# Patient Record
Sex: Female | Born: 1950 | Race: White | Hispanic: No | Smoking: Current every day smoker
Health system: Southern US, Community
[De-identification: ages and names within clinical notes are randomized; demographics above are authoritative.]

## PROBLEM LIST (undated history)

## (undated) DIAGNOSIS — M1990 Unspecified osteoarthritis, unspecified site: Secondary | ICD-10-CM

## (undated) DIAGNOSIS — F419 Anxiety disorder, unspecified: Secondary | ICD-10-CM

## (undated) DIAGNOSIS — M5136 Other intervertebral disc degeneration, lumbar region: Secondary | ICD-10-CM

## (undated) DIAGNOSIS — F329 Major depressive disorder, single episode, unspecified: Secondary | ICD-10-CM

## (undated) DIAGNOSIS — M8580 Other specified disorders of bone density and structure, unspecified site: Secondary | ICD-10-CM

## (undated) DIAGNOSIS — K2270 Barrett's esophagus without dysplasia: Secondary | ICD-10-CM

## (undated) DIAGNOSIS — Z972 Presence of dental prosthetic device (complete) (partial): Secondary | ICD-10-CM

## (undated) DIAGNOSIS — M5030 Other cervical disc degeneration, unspecified cervical region: Secondary | ICD-10-CM

## (undated) DIAGNOSIS — J45909 Unspecified asthma, uncomplicated: Secondary | ICD-10-CM

## (undated) DIAGNOSIS — M5126 Other intervertebral disc displacement, lumbar region: Secondary | ICD-10-CM

## (undated) DIAGNOSIS — K219 Gastro-esophageal reflux disease without esophagitis: Secondary | ICD-10-CM

## (undated) DIAGNOSIS — F32A Depression, unspecified: Secondary | ICD-10-CM

## (undated) DIAGNOSIS — M51369 Other intervertebral disc degeneration, lumbar region without mention of lumbar back pain or lower extremity pain: Secondary | ICD-10-CM

## (undated) DIAGNOSIS — E785 Hyperlipidemia, unspecified: Secondary | ICD-10-CM

## (undated) DIAGNOSIS — G4730 Sleep apnea, unspecified: Secondary | ICD-10-CM

## (undated) DIAGNOSIS — M5430 Sciatica, unspecified side: Secondary | ICD-10-CM

## (undated) DIAGNOSIS — J449 Chronic obstructive pulmonary disease, unspecified: Secondary | ICD-10-CM

## (undated) DIAGNOSIS — K759 Inflammatory liver disease, unspecified: Secondary | ICD-10-CM

## (undated) DIAGNOSIS — G8929 Other chronic pain: Secondary | ICD-10-CM

## (undated) DIAGNOSIS — K449 Diaphragmatic hernia without obstruction or gangrene: Secondary | ICD-10-CM

## (undated) HISTORY — PX: HAMMER TOE SURGERY: SHX385

## (undated) HISTORY — PX: BREAST SURGERY: SHX581

## (undated) HISTORY — PX: ABDOMINAL HYSTERECTOMY: SHX81

## (undated) HISTORY — PX: ESOPHAGEAL DILATION: SHX303

## (undated) HISTORY — PX: EYE SURGERY: SHX253

---

## 1999-08-17 HISTORY — PX: AUGMENTATION MAMMAPLASTY: SUR837

## 2013-06-05 ENCOUNTER — Emergency Department: Payer: Self-pay | Admitting: Emergency Medicine

## 2013-09-18 ENCOUNTER — Emergency Department: Payer: Self-pay | Admitting: Emergency Medicine

## 2013-12-11 LAB — HM MAMMOGRAPHY

## 2014-01-09 DIAGNOSIS — R1110 Vomiting, unspecified: Secondary | ICD-10-CM | POA: Insufficient documentation

## 2014-01-09 LAB — HM COLONOSCOPY
HM COLON: NORMAL
HM Colonoscopy: NORMAL

## 2014-01-10 DIAGNOSIS — E876 Hypokalemia: Secondary | ICD-10-CM | POA: Insufficient documentation

## 2014-07-23 DIAGNOSIS — Z79891 Long term (current) use of opiate analgesic: Secondary | ICD-10-CM | POA: Diagnosis not present

## 2014-07-23 DIAGNOSIS — M4726 Other spondylosis with radiculopathy, lumbar region: Secondary | ICD-10-CM | POA: Diagnosis not present

## 2014-07-23 DIAGNOSIS — M4722 Other spondylosis with radiculopathy, cervical region: Secondary | ICD-10-CM | POA: Diagnosis not present

## 2014-07-23 DIAGNOSIS — G8929 Other chronic pain: Secondary | ICD-10-CM | POA: Diagnosis not present

## 2014-09-04 ENCOUNTER — Emergency Department: Payer: Self-pay | Admitting: Emergency Medicine

## 2014-09-04 DIAGNOSIS — G8929 Other chronic pain: Secondary | ICD-10-CM | POA: Diagnosis not present

## 2014-09-04 DIAGNOSIS — Z880 Allergy status to penicillin: Secondary | ICD-10-CM | POA: Diagnosis not present

## 2014-09-04 DIAGNOSIS — M542 Cervicalgia: Secondary | ICD-10-CM | POA: Diagnosis not present

## 2014-09-04 DIAGNOSIS — M549 Dorsalgia, unspecified: Secondary | ICD-10-CM | POA: Diagnosis not present

## 2014-09-04 DIAGNOSIS — F419 Anxiety disorder, unspecified: Secondary | ICD-10-CM | POA: Diagnosis not present

## 2014-09-26 DIAGNOSIS — J449 Chronic obstructive pulmonary disease, unspecified: Secondary | ICD-10-CM | POA: Diagnosis not present

## 2014-09-26 DIAGNOSIS — F17211 Nicotine dependence, cigarettes, in remission: Secondary | ICD-10-CM | POA: Diagnosis not present

## 2014-09-26 DIAGNOSIS — R0602 Shortness of breath: Secondary | ICD-10-CM | POA: Diagnosis not present

## 2014-09-26 DIAGNOSIS — G479 Sleep disorder, unspecified: Secondary | ICD-10-CM | POA: Diagnosis not present

## 2014-10-21 DIAGNOSIS — Z79891 Long term (current) use of opiate analgesic: Secondary | ICD-10-CM | POA: Diagnosis not present

## 2014-10-21 DIAGNOSIS — M4722 Other spondylosis with radiculopathy, cervical region: Secondary | ICD-10-CM | POA: Diagnosis not present

## 2014-10-21 DIAGNOSIS — M4726 Other spondylosis with radiculopathy, lumbar region: Secondary | ICD-10-CM | POA: Diagnosis not present

## 2014-10-21 DIAGNOSIS — G8929 Other chronic pain: Secondary | ICD-10-CM | POA: Diagnosis not present

## 2014-11-05 DIAGNOSIS — H16223 Keratoconjunctivitis sicca, not specified as Sjogren's, bilateral: Secondary | ICD-10-CM | POA: Diagnosis not present

## 2014-11-13 DIAGNOSIS — H16223 Keratoconjunctivitis sicca, not specified as Sjogren's, bilateral: Secondary | ICD-10-CM | POA: Diagnosis not present

## 2014-12-17 DIAGNOSIS — J449 Chronic obstructive pulmonary disease, unspecified: Secondary | ICD-10-CM | POA: Diagnosis not present

## 2014-12-17 DIAGNOSIS — F17209 Nicotine dependence, unspecified, with unspecified nicotine-induced disorders: Secondary | ICD-10-CM | POA: Insufficient documentation

## 2014-12-17 DIAGNOSIS — G479 Sleep disorder, unspecified: Secondary | ICD-10-CM | POA: Insufficient documentation

## 2014-12-17 DIAGNOSIS — F17210 Nicotine dependence, cigarettes, uncomplicated: Secondary | ICD-10-CM | POA: Insufficient documentation

## 2014-12-17 DIAGNOSIS — J441 Chronic obstructive pulmonary disease with (acute) exacerbation: Secondary | ICD-10-CM | POA: Diagnosis not present

## 2014-12-17 DIAGNOSIS — Z720 Tobacco use: Secondary | ICD-10-CM | POA: Diagnosis not present

## 2014-12-17 DIAGNOSIS — J310 Chronic rhinitis: Secondary | ICD-10-CM | POA: Diagnosis not present

## 2014-12-18 DIAGNOSIS — H16223 Keratoconjunctivitis sicca, not specified as Sjogren's, bilateral: Secondary | ICD-10-CM | POA: Diagnosis not present

## 2014-12-23 DIAGNOSIS — J208 Acute bronchitis due to other specified organisms: Secondary | ICD-10-CM | POA: Diagnosis not present

## 2014-12-23 DIAGNOSIS — J069 Acute upper respiratory infection, unspecified: Secondary | ICD-10-CM | POA: Diagnosis not present

## 2014-12-23 DIAGNOSIS — B9689 Other specified bacterial agents as the cause of diseases classified elsewhere: Secondary | ICD-10-CM | POA: Diagnosis not present

## 2015-01-01 DIAGNOSIS — H16223 Keratoconjunctivitis sicca, not specified as Sjogren's, bilateral: Secondary | ICD-10-CM | POA: Diagnosis not present

## 2015-01-14 DIAGNOSIS — F17210 Nicotine dependence, cigarettes, uncomplicated: Secondary | ICD-10-CM | POA: Diagnosis not present

## 2015-01-14 DIAGNOSIS — J209 Acute bronchitis, unspecified: Secondary | ICD-10-CM | POA: Diagnosis not present

## 2015-01-14 DIAGNOSIS — J310 Chronic rhinitis: Secondary | ICD-10-CM | POA: Diagnosis not present

## 2015-01-14 DIAGNOSIS — J449 Chronic obstructive pulmonary disease, unspecified: Secondary | ICD-10-CM | POA: Diagnosis not present

## 2015-01-14 DIAGNOSIS — R05 Cough: Secondary | ICD-10-CM | POA: Diagnosis not present

## 2015-01-17 DIAGNOSIS — G8929 Other chronic pain: Secondary | ICD-10-CM | POA: Diagnosis not present

## 2015-01-17 DIAGNOSIS — M791 Myalgia: Secondary | ICD-10-CM | POA: Diagnosis not present

## 2015-01-17 DIAGNOSIS — Z79891 Long term (current) use of opiate analgesic: Secondary | ICD-10-CM | POA: Diagnosis not present

## 2015-01-17 DIAGNOSIS — M4726 Other spondylosis with radiculopathy, lumbar region: Secondary | ICD-10-CM | POA: Diagnosis not present

## 2015-02-06 ENCOUNTER — Encounter: Payer: Self-pay | Admitting: *Deleted

## 2015-02-06 ENCOUNTER — Emergency Department
Admission: EM | Admit: 2015-02-06 | Discharge: 2015-02-06 | Disposition: A | Discharge status: Home / Self Care | Payer: Medicare Other | Attending: Emergency Medicine | Admitting: Emergency Medicine

## 2015-02-06 DIAGNOSIS — Y998 Other external cause status: Secondary | ICD-10-CM | POA: Insufficient documentation

## 2015-02-06 DIAGNOSIS — Z79899 Other long term (current) drug therapy: Secondary | ICD-10-CM | POA: Diagnosis not present

## 2015-02-06 DIAGNOSIS — S239XXA Sprain of unspecified parts of thorax, initial encounter: Secondary | ICD-10-CM

## 2015-02-06 DIAGNOSIS — Y9389 Activity, other specified: Secondary | ICD-10-CM | POA: Insufficient documentation

## 2015-02-06 DIAGNOSIS — S233XXA Sprain of ligaments of thoracic spine, initial encounter: Secondary | ICD-10-CM | POA: Diagnosis not present

## 2015-02-06 DIAGNOSIS — S29012A Strain of muscle and tendon of back wall of thorax, initial encounter: Secondary | ICD-10-CM | POA: Insufficient documentation

## 2015-02-06 DIAGNOSIS — S299XXA Unspecified injury of thorax, initial encounter: Secondary | ICD-10-CM | POA: Diagnosis present

## 2015-02-06 DIAGNOSIS — Z720 Tobacco use: Secondary | ICD-10-CM | POA: Diagnosis not present

## 2015-02-06 DIAGNOSIS — Z880 Allergy status to penicillin: Secondary | ICD-10-CM | POA: Diagnosis not present

## 2015-02-06 DIAGNOSIS — Y9289 Other specified places as the place of occurrence of the external cause: Secondary | ICD-10-CM | POA: Insufficient documentation

## 2015-02-06 DIAGNOSIS — X58XXXA Exposure to other specified factors, initial encounter: Secondary | ICD-10-CM | POA: Insufficient documentation

## 2015-02-06 HISTORY — DX: Other chronic pain: G89.29

## 2015-02-06 MED ORDER — KETOROLAC TROMETHAMINE 60 MG/2ML IM SOLN
INTRAMUSCULAR | Status: AC
  Filled 2015-02-06: qty 2

## 2015-02-06 MED ORDER — ORPHENADRINE CITRATE ER 100 MG PO TB12: 100.0000 mg | ORAL_TABLET | Freq: Two times a day (BID) | ORAL | Status: DC | PRN

## 2015-02-06 MED ORDER — KETOROLAC TROMETHAMINE 60 MG/2ML IM SOLN
60.0000 mg | Freq: Once | INTRAMUSCULAR | Status: AC
  Administered 2015-02-06: 60 mg via INTRAMUSCULAR

## 2015-02-06 MED ORDER — KETOROLAC TROMETHAMINE 10 MG PO TABS: 10.0000 mg | ORAL_TABLET | Freq: Three times a day (TID) | ORAL | Status: DC

## 2015-02-06 NOTE — Discharge Instructions (Signed)
Back Pain, Adult Back pain is very common. The pain often gets better over time. The cause of back pain is usually not dangerous. Most people can learn to manage their back pain on their own.  HOME CARE   Stay active. Start with short walks on flat ground if you can. Try to walk farther each day.  Do not sit, drive, or stand in one place for more than 30 minutes. Do not stay in bed.  Do not avoid exercise or work. Activity can help your back heal faster.  Be careful when you bend or lift an object. Bend at your knees, keep the object close to you, and do not twist.  Sleep on a firm mattress. Lie on your side, and bend your knees. If you lie on your back, put a pillow under your knees.  Only take medicines as told by your doctor.  Put ice on the injured area.  Put ice in a plastic bag.  Place a towel between your skin and the bag.  Leave the ice on for 15-20 minutes, 03-04 times a day for the first 2 to 3 days. After that, you can switch between ice and heat packs.  Ask your doctor about back exercises or massage.  Avoid feeling anxious or stressed. Find good ways to deal with stress, such as exercise. GET HELP RIGHT AWAY IF:   Your pain does not go away with rest or medicine.  Your pain does not go away in 1 week.  You have new problems.  You do not feel well.  The pain spreads into your legs.  You cannot control when you poop (bowel movement) or pee (urinate).  Your arms or legs feel weak or lose feeling (numbness).  You feel sick to your stomach (nauseous) or throw up (vomit).  You have belly (abdominal) pain.  You feel like you may pass out (faint). MAKE SURE YOU:   Understand these instructions.  Will watch your condition.  Will get help right away if you are not doing well or get worse. Document Released: 01/19/2008 Document Revised: 10/25/2011 Document Reviewed: 12/04/2013 [facility] Patient Information 2015 [place], [place]. This information is not intended  to replace advice given to you by your health care provider. Make sure you discuss any questions you have with your health care provider.  Thoracic Strain Thoracic strain is an injury to the muscles of the upper back. A mild strain may take only 1 week to heal. Torn muscles or tendons may take 6 weeks to 2 months to heal. HOME CARE  Put ice on the injured area.  Put ice in a plastic bag.  Place a towel between your skin and the bag.  Leave the ice on for 15-20 minutes, 03-04 times a day, for the first 2 days.  Only take medicine as told by your doctor.  Go to physical therapy and perform exercises as told by your doctor.  Use wraps and back braces as told by your doctor.  Warm up before being active. GET HELP RIGHT AWAY IF:   There is more bruising, puffiness (swelling), or pain.  Medicine does not help the pain.  You have trouble breathing, chest pain, or a fever.  Your problems seem to be getting worse, not better. MAKE SURE YOU:   Understand these instructions.  Will watch your condition.  Will get help right away if you are not doing well or get worse. Document Released: 01/19/2008 Document Revised: 10/25/2011 Document Reviewed: 09/21/2010 [employer] Patient Information  2015 [employer]. This information is not intended to replace advice given to you by your health care provider. Make sure you discuss any questions you have with your health care provider.   Take the prescription meds as directed.  Continue your home meds. Follow-up with your provider or Dr. Marry Guan if needed.

## 2015-02-06 NOTE — ED Provider Notes (Signed)
[facility] Emergency Department Provider Note ____________________________________________  Time seen: 1725  I have reviewed the triage vital signs and the nursing notes.  HISTORY  Chief Complaint  Neck Pain and Shoulder Pain  HPI Kim Farmer is a 64 y.o. female, right hand dominant, who reports to the ED for evaluation and management of her acute onset left upper back pain. She describes pain after waking this morning to the right scapular region with some referral into the right side of the neck. She was able to work today, but she kept her arm tucked to her side throughout the day. She is not requesting narcotic pain medicine, noting she is a patient of a pain clinic at the [facility]. She has recently relocated to the area, and has not established care with a primary provider at this time. She denies injury, tripped, slipped, or fall prior to onset of her symptoms. 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  Past Medical History  Diagnosis Date  . Chronic pain    There are no active problems to display for this patient.  History reviewed. No pertinent past surgical history.  Current Outpatient Rx  Name  Route  Sig  Dispense  Refill  . ALPRAZolam (XANAX) 1 MG tablet   Oral   Take 1 mg by mouth 3 (three) times daily.         Marland Kitchen oxyCODONE (ROXICODONE) 15 MG immediate release tablet   Oral   Take 15 mg by mouth every 8 (eight) hours as needed for pain.         Marland Kitchen ketorolac (TORADOL) 10 MG tablet   Oral   Take 1 tablet (10 mg total) by mouth every 8 (eight) hours.   15 tablet   0   . orphenadrine (NORFLEX) 100 MG tablet   Oral   Take 1 tablet (100 mg total) by mouth 2 (two) times daily as needed for muscle spasms.   20 tablet   0    Allergies Amoxicillin; Penicillins; and Sulfa antibiotics  History reviewed. No pertinent  family history.  Social History History  Substance Use Topics  . Smoking status: Current Every Day Smoker  . Smokeless tobacco: Not on file  . Alcohol Use: Not on file   Review of Systems  Constitutional: Negative for fever. Eyes: Negative for visual changes. ENT: Negative for sore throat. Cardiovascular: Negative for chest pain. Respiratory: Negative for shortness of breath. Gastrointestinal: Negative for abdominal pain, vomiting and diarrhea. Genitourinary: Negative for dysuria. Musculoskeletal: Reports right scapular and upper back pain as above. Skin: Negative for rash. Neurological: Negative for headaches, focal weakness or numbness. ____________________________________________  PHYSICAL EXAM:  VITAL SIGNS: ED Triage Vitals  Enc Vitals Group     BP 02/06/15 1700 107/59 mmHg     Pulse --      Resp 02/06/15 1700 18     Temp 02/06/15 1700 98.6 F (37 C)     Temp Source 02/06/15 1700 Oral     SpO2 02/06/15 1700 96 %     Weight 02/06/15 1700 126 lb (57.153 kg)     Height 02/06/15 1700 5\' 3"  (1.6 m)     Head Cir --      Peak Flow --      Pain Score 02/06/15 1701 10     Pain Loc --      Pain Edu? --  Excl. in [place]? --    Constitutional: Alert and oriented. Well appearing and in no distress. Eyes: Conjunctivae are normal. PERRL. Normal extraocular movements. ENT   Head: Normocephalic and atraumatic.   Nose: No congestion/rhinnorhea.   Mouth/Throat: Mucous membranes are moist.   Neck: Supple. No thyromegaly. Hematological/Lymphatic/Immunilogical: No cervical lymphadenopathy. Cardiovascular: Normal rate, regular rhythm. Normal distal pulse. Respiratory: Normal respiratory effort. No wheezes/rales/rhonchi. Musculoskeletal: Nontender with normal range of motion in the neck. Full right shoulder ROM without catch, click, or lock. Normal rotator cuff testing. Normal grip strength. Neurologic:  Normal gait without ataxia. Normal speech and language. No gross  focal neurologic deficits are appreciated. Normal UE DTRs bilaterally.  Normal composite fists Skin:  Skin is warm, dry and intact. No rash noted. Psychiatric: Mood and affect are normal. Patient exhibits appropriate insight and judgment. ____________________________________________  PROCEDURES  Toradol 60 mg IM ____________________________________________  INITIAL IMPRESSION / ASSESSMENT AND PLAN / ED COURSE  Musculoskeletal pain in the right scapulothoracic region with palpable muscle tenderness and spasms. Suggest treatment with Toradol and Norflex.  Patient to follow-up with Dr. Marry Guan or pain management provider as needed.  ____________________________________________  FINAL CLINICAL IMPRESSION(S) / ED DIAGNOSES  Final diagnoses:  Thoracic back sprain, initial encounter     Melvenia Needles, PA-C 02/06/15 1858  Lavonia Drafts, MD 02/06/15 2148

## 2015-02-06 NOTE — ED Notes (Signed)
Pt alert and oriented X4, active, cooperative, pt in NAD. RR even and unlabored, color WNL.  Pt informed to return if any life threatening symptoms occur.   

## 2015-02-06 NOTE — ED Notes (Signed)
Pt arrives with complaints of right neck and shoulder pain, pt is on pain management doctor [facility], pt states she gets shots and Po pain management, pt tearful and anxious during assessment

## 2015-02-10 ENCOUNTER — Telehealth: Payer: Self-pay | Admitting: Emergency Medicine

## 2015-02-10 NOTE — ED Notes (Signed)
Pt is in lobby asking why we did not fax a work release to her employer.  i explained that we do not have contact with a patient's employer unless it is a Insurance underwriter comp case.  She says she had a sling on Friday and they sent her home.  Says she is not longer wearing sling and it is much better.  Gave her note to return to work.

## 2015-03-05 DIAGNOSIS — L239 Allergic contact dermatitis, unspecified cause: Secondary | ICD-10-CM | POA: Diagnosis not present

## 2015-03-13 DIAGNOSIS — D485 Neoplasm of uncertain behavior of skin: Secondary | ICD-10-CM | POA: Diagnosis not present

## 2015-03-13 DIAGNOSIS — D040 Carcinoma in situ of skin of lip: Secondary | ICD-10-CM | POA: Diagnosis not present

## 2015-03-13 DIAGNOSIS — B001 Herpesviral vesicular dermatitis: Secondary | ICD-10-CM | POA: Diagnosis not present

## 2015-03-13 DIAGNOSIS — L309 Dermatitis, unspecified: Secondary | ICD-10-CM | POA: Diagnosis not present

## 2015-03-27 DIAGNOSIS — C4402 Squamous cell carcinoma of skin of lip: Secondary | ICD-10-CM | POA: Diagnosis not present

## 2015-03-27 DIAGNOSIS — D492 Neoplasm of unspecified behavior of bone, soft tissue, and skin: Secondary | ICD-10-CM | POA: Diagnosis not present

## 2015-03-31 DIAGNOSIS — L820 Inflamed seborrheic keratosis: Secondary | ICD-10-CM | POA: Diagnosis not present

## 2015-04-11 DIAGNOSIS — M791 Myalgia: Secondary | ICD-10-CM | POA: Diagnosis not present

## 2015-04-11 DIAGNOSIS — Z79891 Long term (current) use of opiate analgesic: Secondary | ICD-10-CM | POA: Diagnosis not present

## 2015-04-11 DIAGNOSIS — G8929 Other chronic pain: Secondary | ICD-10-CM | POA: Diagnosis not present

## 2015-04-11 DIAGNOSIS — M4726 Other spondylosis with radiculopathy, lumbar region: Secondary | ICD-10-CM | POA: Diagnosis not present

## 2015-04-23 DIAGNOSIS — J441 Chronic obstructive pulmonary disease with (acute) exacerbation: Secondary | ICD-10-CM | POA: Diagnosis not present

## 2015-04-23 DIAGNOSIS — R0602 Shortness of breath: Secondary | ICD-10-CM | POA: Diagnosis not present

## 2015-04-23 DIAGNOSIS — R05 Cough: Secondary | ICD-10-CM | POA: Diagnosis not present

## 2015-04-23 DIAGNOSIS — R042 Hemoptysis: Secondary | ICD-10-CM | POA: Diagnosis not present

## 2015-04-30 DIAGNOSIS — C4402 Squamous cell carcinoma of skin of lip: Secondary | ICD-10-CM | POA: Diagnosis not present

## 2015-05-18 ENCOUNTER — Encounter: Payer: Self-pay | Admitting: Emergency Medicine

## 2015-05-18 ENCOUNTER — Emergency Department: Payer: Medicare Other

## 2015-05-18 ENCOUNTER — Emergency Department
Admission: EM | Admit: 2015-05-18 | Discharge: 2015-05-18 | Disposition: A | Discharge status: Home / Self Care | Payer: Medicare Other | Attending: Emergency Medicine | Admitting: Emergency Medicine

## 2015-05-18 DIAGNOSIS — K769 Liver disease, unspecified: Secondary | ICD-10-CM | POA: Diagnosis not present

## 2015-05-18 DIAGNOSIS — Z7951 Long term (current) use of inhaled steroids: Secondary | ICD-10-CM | POA: Insufficient documentation

## 2015-05-18 DIAGNOSIS — R112 Nausea with vomiting, unspecified: Secondary | ICD-10-CM | POA: Insufficient documentation

## 2015-05-18 DIAGNOSIS — Z720 Tobacco use: Secondary | ICD-10-CM | POA: Diagnosis not present

## 2015-05-18 DIAGNOSIS — Z880 Allergy status to penicillin: Secondary | ICD-10-CM | POA: Insufficient documentation

## 2015-05-18 DIAGNOSIS — R1013 Epigastric pain: Secondary | ICD-10-CM | POA: Insufficient documentation

## 2015-05-18 DIAGNOSIS — Z79899 Other long term (current) drug therapy: Secondary | ICD-10-CM | POA: Diagnosis not present

## 2015-05-18 HISTORY — DX: Barrett's esophagus without dysplasia: K22.70

## 2015-05-18 HISTORY — DX: Diaphragmatic hernia without obstruction or gangrene: K44.9

## 2015-05-18 HISTORY — DX: Gastro-esophageal reflux disease without esophagitis: K21.9

## 2015-05-18 HISTORY — DX: Anxiety disorder, unspecified: F41.9

## 2015-05-18 LAB — HEPATIC FUNCTION PANEL
ALK PHOS: 90 U/L (ref 38–126)
ALT: 16 U/L (ref 14–54)
AST: 20 U/L (ref 15–41)
Albumin: 4.4 g/dL (ref 3.5–5.0)
BILIRUBIN TOTAL: 0.7 mg/dL (ref 0.3–1.2)
Bilirubin, Direct: <0.1 mg/dL — ABNORMAL LOW (ref 0.1–0.5)
Total Protein: 6.9 g/dL (ref 6.5–8.1)

## 2015-05-18 LAB — CBC WITH DIFFERENTIAL/PLATELET
BASOS PCT: 1 %
Basophils Absolute: 0.1 10*3/uL (ref 0–0.1)
EOS PCT: 3 %
Eosinophils Absolute: 0.5 10*3/uL (ref 0–0.7)
HCT: 41.8 % (ref 35.0–47.0)
Hemoglobin: 14.6 g/dL (ref 12.0–16.0)
Lymphocytes Relative: 10 %
Lymphs Abs: 1.9 10*3/uL (ref 1.0–3.6)
MCH: 32.1 pg (ref 26.0–34.0)
MCHC: 34.8 g/dL (ref 32.0–36.0)
MCV: 92.3 fL (ref 80.0–100.0)
MONO ABS: 0.9 10*3/uL (ref 0.2–0.9)
Monocytes Relative: 5 %
NEUTROS ABS: 15.7 10*3/uL — AB (ref 1.4–6.5)
Neutrophils Relative %: 81 %
PLATELETS: 290 10*3/uL (ref 150–440)
RBC: 4.53 MIL/uL (ref 3.80–5.20)
RDW: 12.4 % (ref 11.5–14.5)
WBC: 19.1 10*3/uL — ABNORMAL HIGH (ref 3.6–11.0)

## 2015-05-18 LAB — URINALYSIS COMPLETE WITH MICROSCOPIC (ARMC ONLY)
BILIRUBIN URINE: NEGATIVE
Bacteria, UA: NONE SEEN
GLUCOSE, UA: NEGATIVE mg/dL
HGB URINE DIPSTICK: NEGATIVE
Ketones, ur: NEGATIVE mg/dL
Leukocytes, UA: NEGATIVE
NITRITE: NEGATIVE
Protein, ur: NEGATIVE mg/dL
SPECIFIC GRAVITY, URINE: 1.047 — AB (ref 1.005–1.030)
pH: 8 (ref 5.0–8.0)

## 2015-05-18 LAB — TROPONIN I

## 2015-05-18 LAB — SALICYLATE LEVEL: Salicylate Lvl: <4 mg/dL (ref 2.8–30.0)

## 2015-05-18 LAB — BASIC METABOLIC PANEL
ANION GAP: 8 (ref 5–15)
BUN: 13 mg/dL (ref 6–20)
CO2: 28 mmol/L (ref 22–32)
Calcium: 9.3 mg/dL (ref 8.9–10.3)
Chloride: 106 mmol/L (ref 101–111)
Creatinine, Ser: 0.81 mg/dL (ref 0.44–1.00)
GFR calc Af Amer: >60 mL/min (ref >60)
GLUCOSE: 133 mg/dL — AB (ref 65–99)
Potassium: 4 mmol/L (ref 3.5–5.1)
Sodium: 142 mmol/L (ref 135–145)

## 2015-05-18 LAB — ACETAMINOPHEN LEVEL: Acetaminophen (Tylenol), Serum: <10 ug/mL — ABNORMAL LOW (ref 10–30)

## 2015-05-18 LAB — ETHANOL: Alcohol, Ethyl (B): <5 mg/dL (ref <5)

## 2015-05-18 LAB — LACTIC ACID, PLASMA
Lactic Acid, Venous: 2.2 mmol/L (ref 0.5–2.0)
Lactic Acid, Venous: 1.4 mmol/L (ref 0.5–2.0)

## 2015-05-18 LAB — LIPASE, BLOOD: Lipase: 33 U/L (ref 22–51)

## 2015-05-18 MED ORDER — SODIUM CHLORIDE 0.9 % IV BOLUS (SEPSIS)
1000.0000 mL | INTRAVENOUS | Status: AC
Start: 2015-05-18 — End: 2015-05-18
  Administered 2015-05-18: 1000 mL via INTRAVENOUS

## 2015-05-18 MED ORDER — HYDROMORPHONE HCL 1 MG/ML IJ SOLN
0.5000 mg | Freq: Once | INTRAMUSCULAR | Status: AC
  Administered 2015-05-18: 0.5 mg via INTRAVENOUS
  Filled 2015-05-18: qty 1

## 2015-05-18 MED ORDER — IOHEXOL 240 MG/ML SOLN: 25.0000 mL | Freq: Once | INTRAMUSCULAR | Status: DC | PRN

## 2015-05-18 MED ORDER — ONDANSETRON HCL 4 MG PO TABS: 4.0000 mg | ORAL_TABLET | Freq: Every day | ORAL | Status: DC | PRN

## 2015-05-18 MED ORDER — HALOPERIDOL LACTATE 5 MG/ML IJ SOLN
2.5000 mg | Freq: Once | INTRAMUSCULAR | Status: AC
  Administered 2015-05-18: 2.5 mg via INTRAVENOUS
  Filled 2015-05-18: qty 1

## 2015-05-18 MED ORDER — ONDANSETRON HCL 4 MG/2ML IJ SOLN
INTRAMUSCULAR | Status: AC
  Administered 2015-05-18: 4 mg via INTRAVENOUS
  Filled 2015-05-18: qty 2

## 2015-05-18 MED ORDER — MORPHINE SULFATE (PF) 4 MG/ML IV SOLN
4.0000 mg | Freq: Once | INTRAVENOUS | Status: AC
  Administered 2015-05-18: 4 mg via INTRAVENOUS
  Filled 2015-05-18: qty 1

## 2015-05-18 MED ORDER — ONDANSETRON HCL 4 MG/2ML IJ SOLN
4.0000 mg | Freq: Once | INTRAMUSCULAR | Status: AC | PRN
  Administered 2015-05-18: 4 mg via INTRAVENOUS

## 2015-05-18 MED ORDER — ONDANSETRON HCL 4 MG/2ML IJ SOLN
4.0000 mg | Freq: Once | INTRAMUSCULAR | Status: AC | PRN
  Administered 2015-05-18 (×2): 4 mg via INTRAVENOUS
  Filled 2015-05-18: qty 2

## 2015-05-18 MED ORDER — DIPHENHYDRAMINE HCL 50 MG/ML IJ SOLN
12.5000 mg | Freq: Once | INTRAMUSCULAR | Status: AC
  Administered 2015-05-18: 12.5 mg via INTRAVENOUS
  Filled 2015-05-18: qty 1

## 2015-05-18 MED ORDER — IOHEXOL 350 MG/ML SOLN
100.0000 mL | Freq: Once | INTRAVENOUS | Status: AC | PRN
  Administered 2015-05-18: 80 mL via INTRAVENOUS

## 2015-05-18 NOTE — ED Notes (Signed)
MD at bedside for eval.

## 2015-05-18 NOTE — ED Notes (Signed)
E signature pad not working 

## 2015-05-18 NOTE — ED Notes (Signed)
Patient tolerating sips of fluid and few bites of crackers.

## 2015-05-18 NOTE — ED Notes (Signed)
Report given to Ashley, RN

## 2015-05-18 NOTE — ED Notes (Signed)
Pt c/o epigastric pain that started around 0030; vomiting bile; pt with history of hiatal hernia, GERD and Barretts esophagus; pt says anything she eats or drinks comes right back up;

## 2015-05-18 NOTE — ED Notes (Signed)
Lab called re: ethanol level, they have received the specimen but have not processed it yet.

## 2015-05-18 NOTE — ED Provider Notes (Addendum)
-----------------------------------------   8:42 AM on 05/18/2015 -----------------------------------------  Signed out to me at 8:15 this morning, patient not vomiting, states she feels better. She is here with what she describes as nausea vomiting and diarrhea. Also diffuse abdominal pain control in the epigastric region. CT scan is consistent with his blood work is consistent with this. Abdominal exam this time shows no evidence of surgical pathology, she has mild diffuse tenderness. No guarding and no rebound. Abdomen soft with good bowel sounds. Patient has no evidence of ischemia. She denies GI bleed. Patient very anxious about going home. Review of her [place] provider database suggests that she may be out of her home oxycodone although she states she is not. Patient had 180 pills filled on October 2, 20 mg oxycodone. She states she took one last night. However, it is the weekend, she cannot see a pain doctor today, it is 30 days since she last filled prescription, she had 30 days' worth, she is presenting with symptoms that could certainly be consistent with narcotic withdrawal. She is worried like going home could she thinks the pain will come back. I have agreed to give her one more shot of pain medication here and we will try by mouth. Serial abdominal exams betray no evidence of acute intra-abdominal pathology and her blood work and all signs are reviewed as well  as her CT scan. Extensive return precautions and follow-up have been suggested  Schuyler Amor, MD 05/18/15 0844  ----------------------------------------- 10:26 AM on 05/18/2015 -----------------------------------------  As expected lactate is down to normal, patient in no acute distress abdomen benign tolerating by mouth we'll discharge. Return precautions and follow-up understood  Schuyler Amor, MD 05/18/15 1026

## 2015-05-18 NOTE — ED Provider Notes (Signed)
[facility] Emergency Department Provider Note  ____________________________________________  Time seen: Approximately 5:54 AM  I have reviewed the triage vital signs and the nursing notes.   HISTORY  Chief Complaint Emesis and Abdominal Pain    HPI Kim Farmer is a 64 y.o. female with a history of Barrett's esophagus, acid reflux, hiatal hernia, chronic pain, anxiety, and according to her daughter, regular and heavy alcohol use, who presents with acute onset of severe epigastric pain and persistent vomitingover the last several hours.  She states that she was asleep when the pain began; she awoke with severe pain and "vomiting everywhere".  The pain is severe, sharp and burning, located in her epigastrium.  She has had multiple episodes of emesis and now "only yellow stuff comes out".  She denies shortness of breath and dysuria as well as fever and chills.  She did not specifically endorse alcohol use, but I spoke with her daughter outside the room and she stated that her mother is a heavy drinker and smoker.  She also said that she is "dramatic" and has problems with anxiety, and it is difficult to determine when she is extremely ill versus not.   Past Medical History  Diagnosis Date  . Chronic pain   . Hiatal hernia   . GERD (gastroesophageal reflux disease)   . Barrett esophagus   . Anxiety     There are no active problems to display for this patient.   Past Surgical History  Procedure Laterality Date  . Abdominal hysterectomy    . Eye surgery    . Foot surgery      Current Outpatient Rx  Name  Route  Sig  Dispense  Refill  . ALPRAZolam (XANAX) 1 MG tablet   Oral   Take 1 mg by mouth 3 (three) times daily.         . Calcium-Vitamin D 600-200 MG-UNIT tablet   Oral   Take 1 tablet by mouth daily.         Marland Kitchen DEXILANT 60 MG capsule   Oral   Take 1 capsule by mouth daily.      9     Dispense as written.   . Fluticasone-Salmeterol  (ADVAIR DISKUS) 250-50 MCG/DOSE AEPB   Inhalation   Inhale 1 puff into the lungs 2 (two) times daily.         . Omega-3 Fatty Acids (FISH OIL) 1000 MG CAPS   Oral   Take 1 capsule by mouth daily.         . Oxycodone HCl 20 MG TABS   Oral   Take 1 tablet by mouth every 4 (four) hours as needed.      0   . VOLTAREN 1 % GEL   Topical   Apply 1 application topically daily.      1     Dispense as written.   Marland Kitchen ketorolac (TORADOL) 10 MG tablet   Oral   Take 1 tablet (10 mg total) by mouth every 8 (eight) hours.   15 tablet   0   . orphenadrine (NORFLEX) 100 MG tablet   Oral   Take 1 tablet (100 mg total) by mouth 2 (two) times daily as needed for muscle spasms.   20 tablet   0     Allergies Amoxicillin; Penicillins; and Sulfa antibiotics  History reviewed. No pertinent family history.  Social History Social History  Substance Use Topics  . Smoking status: Current Every Day Smoker -- 1.00  packs/day    Types: Cigarettes  . Smokeless tobacco: Never Used  . Alcohol Use: Yes     Comment: 2 beers today - daughter states the patient drinks regularly and heavily    Review of Systems Constitutional: No fever/chills Eyes: No visual changes. ENT: No sore throat. Cardiovascular: Denies chest pain. Respiratory: Denies shortness of breath. Gastrointestinal: Severe burning and sharp epigastric pain with multiple episodes of vomiting.  No diarrhea.  No constipation. Genitourinary: Negative for dysuria. Musculoskeletal: Negative for back pain. Skin: Negative for rash. Neurological: Negative for headaches, focal weakness or numbness.  10-point ROS otherwise negative.  ____________________________________________   PHYSICAL EXAM:  ED Triage Vitals  Enc Vitals Group     BP --      Pulse Rate 05/18/15 0405 55     Resp 05/18/15 0405 20     Temp 05/18/15 0405 98.3 F (36.8 C)     Temp Source 05/18/15 0405 Oral     SpO2 05/18/15 0405 100 %     Weight 05/18/15 0405  130 lb (58.968 kg)     Height 05/18/15 0405 5\' 3"  (1.6 m)     Head Cir --      Peak Flow --      Pain Score 05/18/15 0406 10     Pain Loc --      Pain Edu? --      Excl. in [place]? --      Constitutional: Alert and oriented but anxious and in moderate distress.  She is actively vomiting in the room and moaning and crying out in pain. Eyes: Conjunctivae are normal. PERRL. EOMI. Head: Atraumatic. Nose: No congestion/rhinnorhea. Mouth/Throat: Mucous membranes are moist.  Oropharynx non-erythematous. Neck: No stridor.   Cardiovascular: Borderline bradycardia, regular rhythm. Grossly normal heart sounds.  Good peripheral circulation. Respiratory: Normal respiratory effort.  No retractions. Lungs CTAB. Gastrointestinal: Abdomen is soft but severely tender to palpation with the slightest touch.  Difficult to assess any focality of tenderness given the patient's dramatic response. Musculoskeletal: No lower extremity tenderness nor edema.  No joint effusions. Neurologic:  Normal speech and language. No gross focal neurologic deficits are appreciated.  Skin:  Skin is warm, dry and intact. No rash noted. Psychiatric: Mood and affect are anxious and upset. Speech and behavior are normal.  ____________________________________________   LABS (all labs ordered are listed, but only abnormal results are displayed)  Labs Reviewed  BASIC METABOLIC PANEL - Abnormal; Notable for the following:    Glucose, Bld 133 (*)    All other components within normal limits  CBC WITH DIFFERENTIAL/PLATELET - Abnormal; Notable for the following:    WBC 19.1 (*)    Neutro Abs 15.7 (*)    All other components within normal limits  HEPATIC FUNCTION PANEL - Abnormal; Notable for the following:    Bilirubin, Direct <0.1 (*)    All other components within normal limits  LACTIC ACID, PLASMA - Abnormal; Notable for the following:    Lactic Acid, Venous 2.2 (*)    All other components within normal limits  ACETAMINOPHEN  LEVEL - Abnormal; Notable for the following:    Acetaminophen (Tylenol), Serum <10 (*)    All other components within normal limits  LIPASE, BLOOD  TROPONIN I  SALICYLATE LEVEL  URINALYSIS COMPLETEWITH MICROSCOPIC ([facility] ONLY)  LACTIC ACID, PLASMA  ETHANOL   ____________________________________________  EKG  Pending ____________________________________________  RADIOLOGY   Dg Abd Acute W/chest  05/18/2015   CLINICAL DATA:  Nausea and vomiting.  EXAM:  DG ABDOMEN ACUTE W/ 1V CHEST  COMPARISON:  None.  FINDINGS: Hyperinflated lungs compatible COPD in this smoker. Symmetric biapical pleural thickening. There is no edema, consolidation, effusion, or pneumothorax. Normal heart size and aortic contours.  Nonobstructive bowel gas pattern with relatively gasless abdomen. No evidence of pneumatosis or pneumoperitoneum. The right liver tip reaches the right iliac fossa, but has a narrow morphology and may reflect Riedel's lobe rather than hepatomegaly. No evidence of urolithiasis. Advanced bilateral hip osteoarthritis with spurring.  IMPRESSION: 1. No acute finding in the chest or abdomen. 2. Probable COPD.   Electronically Signed   By: Monte Fantasia M.D.   On: 05/18/2015 06:28    ____________________________________________   PROCEDURES  Procedure(s) performed: None  Critical Care performed: No ____________________________________________   INITIAL IMPRESSION / ASSESSMENT AND PLAN / ED COURSE  Pertinent labs & imaging results that were available during my care of the patient were reviewed by me and considered in my medical decision making (see chart for details).  The patient appears to be in severe pain.  I am obtaining an acute abdomen plain films to rule out free air and other potential surgical abnormality such as volvulus.  I anticipate the patient will need a CT with by mouth and IV contrast if she can tolerate the by mouth.  She has received 2 doses of Zofran and I will proceed  with Haldol as an additional anti-medic as well as morphine for her pain.  I will give her a liter of IV fluids.  She has a significant leukocytosis but is difficult to determine if this is a stress response but it likely is representative of an infectious process however she is afebrile.  Her heart rate is surprisingly low given her presentation.  We will monitor her carefully.  ----------------------------------------- 8:09 AM on 05/18/2015 -----------------------------------------  Transferring care to Dr. Burlene Arnt.  ____________________________________________  FINAL CLINICAL IMPRESSION(S) / ED DIAGNOSES  Final diagnoses:  Epigastric pain  Nausea and vomiting, vomiting of unspecified type      NEW MEDICATIONS STARTED DURING THIS VISIT:  New Prescriptions   No medications on file     Hinda Kehr, MD 05/18/15 [phone]

## 2015-05-18 NOTE — Discharge Instructions (Signed)

## 2015-06-13 ENCOUNTER — Encounter: Payer: Self-pay | Admitting: Family Medicine

## 2015-06-16 ENCOUNTER — Encounter: Payer: Self-pay | Admitting: Family Medicine

## 2015-06-16 ENCOUNTER — Ambulatory Visit (INDEPENDENT_AMBULATORY_CARE_PROVIDER_SITE_OTHER): Payer: Medicare Other | Admitting: Family Medicine

## 2015-06-16 VITALS — BP 136/68 | HR 71 | Temp 98.0°F | Resp 18 | Ht 63.0 in | Wt 127.4 lb

## 2015-06-16 DIAGNOSIS — Z716 Tobacco abuse counseling: Secondary | ICD-10-CM

## 2015-06-16 DIAGNOSIS — J432 Centrilobular emphysema: Secondary | ICD-10-CM | POA: Insufficient documentation

## 2015-06-16 DIAGNOSIS — M150 Primary generalized (osteo)arthritis: Secondary | ICD-10-CM

## 2015-06-16 DIAGNOSIS — M2550 Pain in unspecified joint: Secondary | ICD-10-CM

## 2015-06-16 DIAGNOSIS — Z23 Encounter for immunization: Secondary | ICD-10-CM

## 2015-06-16 DIAGNOSIS — Z9989 Dependence on other enabling machines and devices: Secondary | ICD-10-CM

## 2015-06-16 DIAGNOSIS — H16223 Keratoconjunctivitis sicca, not specified as Sjogren's, bilateral: Secondary | ICD-10-CM

## 2015-06-16 DIAGNOSIS — E785 Hyperlipidemia, unspecified: Secondary | ICD-10-CM | POA: Insufficient documentation

## 2015-06-16 DIAGNOSIS — M159 Polyosteoarthritis, unspecified: Secondary | ICD-10-CM

## 2015-06-16 DIAGNOSIS — D72829 Elevated white blood cell count, unspecified: Secondary | ICD-10-CM

## 2015-06-16 DIAGNOSIS — H169 Unspecified keratitis: Secondary | ICD-10-CM | POA: Diagnosis not present

## 2015-06-16 DIAGNOSIS — Z79899 Other long term (current) drug therapy: Secondary | ICD-10-CM | POA: Diagnosis not present

## 2015-06-16 DIAGNOSIS — R1310 Dysphagia, unspecified: Secondary | ICD-10-CM

## 2015-06-16 DIAGNOSIS — K219 Gastro-esophageal reflux disease without esophagitis: Secondary | ICD-10-CM | POA: Diagnosis not present

## 2015-06-16 DIAGNOSIS — G8929 Other chronic pain: Secondary | ICD-10-CM

## 2015-06-16 DIAGNOSIS — M21619 Bunion of unspecified foot: Secondary | ICD-10-CM | POA: Insufficient documentation

## 2015-06-16 DIAGNOSIS — F411 Generalized anxiety disorder: Secondary | ICD-10-CM

## 2015-06-16 DIAGNOSIS — K449 Diaphragmatic hernia without obstruction or gangrene: Secondary | ICD-10-CM | POA: Insufficient documentation

## 2015-06-16 DIAGNOSIS — J449 Chronic obstructive pulmonary disease, unspecified: Secondary | ICD-10-CM | POA: Diagnosis not present

## 2015-06-16 DIAGNOSIS — M5416 Radiculopathy, lumbar region: Secondary | ICD-10-CM | POA: Insufficient documentation

## 2015-06-16 DIAGNOSIS — A6009 Herpesviral infection of other urogenital tract: Secondary | ICD-10-CM | POA: Insufficient documentation

## 2015-06-16 DIAGNOSIS — M3501 Sicca syndrome with keratoconjunctivitis: Secondary | ICD-10-CM

## 2015-06-16 DIAGNOSIS — M1990 Unspecified osteoarthritis, unspecified site: Secondary | ICD-10-CM | POA: Insufficient documentation

## 2015-06-16 DIAGNOSIS — M5412 Radiculopathy, cervical region: Secondary | ICD-10-CM | POA: Insufficient documentation

## 2015-06-16 DIAGNOSIS — G4733 Obstructive sleep apnea (adult) (pediatric): Secondary | ICD-10-CM | POA: Insufficient documentation

## 2015-06-16 DIAGNOSIS — M5136 Other intervertebral disc degeneration, lumbar region: Secondary | ICD-10-CM | POA: Insufficient documentation

## 2015-06-16 DIAGNOSIS — Z85828 Personal history of other malignant neoplasm of skin: Secondary | ICD-10-CM | POA: Insufficient documentation

## 2015-06-16 MED ORDER — VARENICLINE TARTRATE 0.5 MG PO TABS: 0.5000 mg | ORAL_TABLET | Freq: Two times a day (BID) | ORAL | Status: DC

## 2015-06-16 MED ORDER — CITALOPRAM HYDROBROMIDE 20 MG PO TABS: 20.0000 mg | ORAL_TABLET | Freq: Every day | ORAL | Status: DC

## 2015-06-16 MED ORDER — VARENICLINE TARTRATE 1 MG PO TABS: 1.0000 mg | ORAL_TABLET | Freq: Two times a day (BID) | ORAL | Status: DC

## 2015-06-16 NOTE — Progress Notes (Signed)
Name: Kim Farmer   MRN: [id number]    DOB: [birth year]   Date:06/16/2015       Progress Note  Subjective  Chief Complaint  Chief Complaint  Patient presents with  . Establish Care  . Gastroesophageal Reflux    Patient chokes on food, meats and has had her esophagus stretch.  . Pain    From a injury -7 years ago, sees pain doctor and covers Alprazolam, Oxycodone, Predinsone and Volatern Gel  . COPD    sees Dr. Raul Del at [facility] and prescribes her Inhalers.  . Sleep Apnea    Has never been able to perform Sleep Study due to Insurance    HPI  GAD: patient states she has a long history of anxiety, took Duloxetine and Elavil in the past. Currently only on Alprazolam, but still feels anxious, she worries all the time, has episodes of panic attack _ she states mother had similar symptoms.   GERD: she has been taking Omeprazole, and reflux symptoms have been controlled, but she has a history of esophageal stricture and recently chocking with hard food, but also pills.   Chronic pain: sees pain clinic in [facility] in [place] . She is going there since injured in 2009 - after an injury at [place].  She has cervical DDD and lumbar radiculitis, chronic pain is stable, pain level at this time 4-5/10, constant but not unbearable. Denies constipation or mental fogginess  COPD: she has been seeing Dr. Raul Del and is using Advair and Proair, had spirometry recently. Needs PCV 13, but up to date with other shots.   OSA: she has been using CPAP every night, all night, she states she is able to sleep much better since she has been on CPAP and alprazolam.   Tobacco Cessation: she was able to quit smoking years ago with Chantix and would like to try it again. No side effects at the time.   Arthralgia with deformities and keratitis - discussed getting labs to rule out Sjogrens' and other inflammatory arthritis but she wants to hold off for now, because of cost.    Patient Active Problem List   Diagnosis Date Noted  . COPD, mild ([place]) 06/16/2015  . Cervical radiculitis 06/16/2015  . DDD (degenerative disc disease), lumbar 06/16/2015  . Osteoarthritis 06/16/2015  . Chronic pain 06/16/2015  . Bunion 06/16/2015  . GAD (generalized anxiety disorder) 06/16/2015  . Lumbar radiculitis 06/16/2015  . Dyslipidemia 06/16/2015  . Hiatal hernia 06/16/2015  . GERD without esophagitis 06/16/2015  . 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 History of skin cancer 06/16/2015  . OSA on CPAP 06/16/2015  . Tobacco use disorder, continuous 12/17/2014  . Disturbance in sleep behavior 12/17/2014    Past Surgical History  Procedure Laterality Date  . Abdominal hysterectomy    . Eye surgery    . Foot surgery    . Esophageal dilation      Family History  Problem Relation Age of Onset  . Diabetes Mother   . Hypertension Mother   . Diabetes Father   . Hypertension Father   . COPD Sister   . Diabetes Sister   . Emphysema Sister     Social History   Social History  . Marital Status: Single    Spouse Name: N/A  . Number of Children: N/A  . Years of Education: N/A   Occupational History  . Not on file.   Social  History Main Topics  . Smoking status: Current Every Day Smoker -- 1.00 packs/day for 40 years    Types: Cigarettes    Start date: 06/16/1975  . Smokeless tobacco: Never Used  . Alcohol Use: 0.0 oz/week    0 Standard drinks or equivalent per week     Comment: 2 beers today - daughter states the patient drinks regularly and heavily  . Drug Use: No  . Sexual Activity: Not Currently   Other Topics Concern  . Not on file   Social History Narrative     Current outpatient prescriptions:  .  albuterol (PROAIR HFA) 108 (90 BASE) MCG/ACT inhaler, Inhale into the lungs., Disp: , Rfl:  .  ALPRAZolam (XANAX) 1 MG tablet, Take 1 mg by mouth 3 (three) times daily., Disp: , Rfl:  .  Calcium-Vitamin D  600-200 MG-UNIT tablet, Take 1 tablet by mouth daily., Disp: , Rfl:  .  fluticasone (FLONASE) 50 MCG/ACT nasal spray, U 1 SPRAY IEN QD, Disp: , Rfl: 6 .  Fluticasone-Salmeterol (ADVAIR DISKUS) 250-50 MCG/DOSE AEPB, Inhale 1 puff into the lungs 2 (two) times daily., Disp: , Rfl:  .  Omega-3 Fatty Acids (FISH OIL) 1000 MG CAPS, Take 1 capsule by mouth daily., Disp: , Rfl:  .  omeprazole (PRILOSEC) 20 MG capsule, Take by mouth., Disp: , Rfl:  .  Oxycodone HCl 20 MG TABS, Take 1 tablet by mouth every 4 (four) hours as needed., Disp: , Rfl: 0 .  prednisoLONE acetate (PRED FORTE) 1 % ophthalmic suspension, INSTILL 2 DROPS IN BOTH EYES Q 4 H, Disp: , Rfl: 0 .  predniSONE (DELTASONE) 10 MG tablet, , Disp: , Rfl: 0 .  valACYclovir (VALTREX) 1000 MG tablet, , Disp: , Rfl: 3 .  VOLTAREN 1 % GEL, Apply 1 application topically daily., Disp: , Rfl: 1 .  citalopram (CELEXA) 20 MG tablet, Take 1 tablet (20 mg total) by mouth daily., Disp: 30 tablet, Rfl: 0 .  varenicline (CHANTIX CONTINUING MONTH PAK) 1 MG tablet, Take 1 tablet (1 mg total) by mouth 2 (two) times daily., Disp: 60 tablet, Rfl: 2 .  varenicline (CHANTIX) 0.5 MG tablet, Take 1 tablet (0.5 mg total) by mouth 2 (two) times daily., Disp: 14 tablet, Rfl: 0  Allergies  Allergen Reactions  . Penicillins   . Sulfa Antibiotics      ROS  Constitutional: Negative for fever or weight change.  Respiratory: positive  for mild cough and shortness of breath.   Cardiovascular: Negative for chest pain or palpitations.  Gastrointestinal: Negative for abdominal pain, no bowel changes. Dysphagia Musculoskeletal: Negative for gait problem or joint swelling.  Skin: Positive  For intermittent  Rash on her back .  Neurological: Negative for dizziness or headache.  No other specific complaints in a complete review of systems (except as listed in HPI above).  Objective  Filed Vitals:   06/16/15 1611  BP: 136/68  Pulse: 71  Temp: 98 F (36.7 C)  TempSrc:  Oral  Resp: 18  Height: 5' 3" (1.6 m)  Weight: 127 lb 6.4 oz (57.788 kg)  SpO2: 98%    Body mass index is 22.57 kg/(m^2).  Physical Exam  Constitutional: Patient appears well-developed and well-nourished. No distress.  HENT: Head: Normocephalic and atraumatic. Ears: B TMs ok, no erythema or effusion; Nose: Nose normal. Mouth/Throat: Oropharynx is clear and moist. No oropharyngeal exudate.  Eyes: Conjunctivae and EOM are normal. Pupils are equal, round, and reactive to light. No scleral icterus.  Neck: Normal  range of motion. Neck supple. No JVD present. No thyromegaly present.  Cardiovascular: Normal rate, regular rhythm and normal heart sounds.  No murmur heard. No BLE edema. Pulmonary/Chest: Effort normal and breath sounds normal. No respiratory distress. Abdominal: Soft. Bowel sounds are normal, no distension. There is no tenderness. no masses Musculoskeletal: Decrease abduction of both hips, no joint effusions, DIP joints on both hands, also large bunion bilaterally . Neurological: he is alert and oriented to person, place, and time. No cranial nerve deficit. Coordination, balance, strength, speech and gait are normal.  Skin: Skin is warm and dry. Rash on back - per patient for years- seen by Dermatologist over the past 2 months, advised to go back. No erythema.  Psychiatric: Patient has a hyper mood and pressure speech. Judgment and thought content normal.  Recent Results (from the past 2160 hour(s))  Ethanol     Status: None   Collection Time: 05/18/15 12:26 AM  Result Value Ref Range   Alcohol, Ethyl (B) <5 <5 mg/dL    Comment:        LOWEST DETECTABLE LIMIT FOR SERUM ALCOHOL IS 5 mg/dL FOR MEDICAL PURPOSES ONLY   Salicylate level     Status: None   Collection Time: 05/18/15 12:26 AM  Result Value Ref Range   Salicylate Lvl <6.2 2.8 - 30.0 mg/dL  Acetaminophen level     Status: Abnormal   Collection Time: 05/18/15 12:26 AM  Result Value Ref Range   Acetaminophen  (Tylenol), Serum <10 (L) 10 - 30 ug/mL    Comment:        THERAPEUTIC CONCENTRATIONS VARY SIGNIFICANTLY. A RANGE OF 10-30 ug/mL MAY BE AN EFFECTIVE CONCENTRATION FOR MANY PATIENTS. HOWEVER, SOME ARE BEST TREATED AT CONCENTRATIONS OUTSIDE THIS RANGE. ACETAMINOPHEN CONCENTRATIONS >150 ug/mL AT 4 HOURS AFTER INGESTION AND >50 ug/mL AT 12 HOURS AFTER INGESTION ARE OFTEN ASSOCIATED WITH TOXIC REACTIONS.   Hepatic function panel     Status: Abnormal   Collection Time: 05/18/15  4:25 AM  Result Value Ref Range   Total Protein 6.9 6.5 - 8.1 g/dL   Albumin 4.4 3.5 - 5.0 g/dL   AST 20 15 - 41 U/L   ALT 16 14 - 54 U/L   Alkaline Phosphatase 90 38 - 126 U/L   Total Bilirubin 0.7 0.3 - 1.2 mg/dL   Bilirubin, Direct <0.1 (L) 0.1 - 0.5 mg/dL   Indirect Bilirubin NOT CALCULATED 0.3 - 0.9 mg/dL  Troponin I     Status: None   Collection Time: 05/18/15  4:25 AM  Result Value Ref Range   Troponin I <0.03 <0.031 ng/mL    Comment:        NO INDICATION OF MYOCARDIAL INJURY.   Basic metabolic panel     Status: Abnormal   Collection Time: 05/18/15  4:26 AM  Result Value Ref Range   Sodium 142 135 - 145 mmol/L   Potassium 4.0 3.5 - 5.1 mmol/L   Chloride 106 101 - 111 mmol/L   CO2 28 22 - 32 mmol/L   Glucose, Bld 133 (H) 65 - 99 mg/dL   BUN 13 6 - 20 mg/dL   Creatinine, Ser 0.81 0.44 - 1.00 mg/dL   Calcium 9.3 8.9 - 10.3 mg/dL   GFR calc non Af Amer >60 >60 mL/min   GFR calc Af Amer >60 >60 mL/min    Comment: (NOTE) The eGFR has been calculated using the CKD EPI equation. This calculation has not been validated in all clinical situations. eGFR's persistently <  60 mL/min signify possible Chronic Kidney Disease.    Anion gap 8 5 - 15  CBC WITH DIFFERENTIAL     Status: Abnormal   Collection Time: 05/18/15  4:26 AM  Result Value Ref Range   WBC 19.1 (H) 3.6 - 11.0 K/uL   RBC 4.53 3.80 - 5.20 MIL/uL   Hemoglobin 14.6 12.0 - 16.0 g/dL   HCT 41.8 35.0 - 47.0 %   MCV 92.3 80.0 - 100.0 fL    MCH 32.1 26.0 - 34.0 pg   MCHC 34.8 32.0 - 36.0 g/dL   RDW 12.4 11.5 - 14.5 %   Platelets 290 150 - 440 K/uL   Neutrophils Relative % 81 %   Neutro Abs 15.7 (H) 1.4 - 6.5 K/uL   Lymphocytes Relative 10 %   Lymphs Abs 1.9 1.0 - 3.6 K/uL   Monocytes Relative 5 %   Monocytes Absolute 0.9 0.2 - 0.9 K/uL   Eosinophils Relative 3 %   Eosinophils Absolute 0.5 0 - 0.7 K/uL   Basophils Relative 1 %   Basophils Absolute 0.1 0 - 0.1 K/uL  Lipase, blood     Status: None   Collection Time: 05/18/15  4:26 AM  Result Value Ref Range   Lipase 33 22 - 51 U/L  Lactic acid, plasma     Status: Abnormal   Collection Time: 05/18/15  6:27 AM  Result Value Ref Range   Lactic Acid, Venous 2.2 (HH) 0.5 - 2.0 mmol/L    Comment: CRITICAL RESULT CALLED TO, READ BACK BY AND VERIFIED WITH DONALD SWEENEY AT 1423 05/18/15 DAS   Urinalysis complete, with microscopic ([facility] only)     Status: Abnormal   Collection Time: 05/18/15  8:43 AM  Result Value Ref Range   Color, Urine STRAW (A) YELLOW   APPearance CLEAR (A) CLEAR   Glucose, UA NEGATIVE NEGATIVE mg/dL   Bilirubin Urine NEGATIVE NEGATIVE   Ketones, ur NEGATIVE NEGATIVE mg/dL   Specific Gravity, Urine 1.047 (H) 1.005 - 1.030   Hgb urine dipstick NEGATIVE NEGATIVE   pH 8.0 5.0 - 8.0   Protein, ur NEGATIVE NEGATIVE mg/dL   Nitrite NEGATIVE NEGATIVE   Leukocytes, UA NEGATIVE NEGATIVE   RBC / HPF 0-5 0 - 5 RBC/hpf   WBC, UA 0-5 0 - 5 WBC/hpf   Bacteria, UA NONE SEEN NONE SEEN   Squamous Epithelial / LPF 0-5 (A) NONE SEEN  Lactic acid, plasma     Status: None   Collection Time: 05/18/15  9:19 AM  Result Value Ref Range   Lactic Acid, Venous 1.4 0.5 - 2.0 mmol/L    PHQ2/9: Depression screen PHQ 2/9 06/16/2015  Decreased Interest 0  Down, Depressed, Hopeless 0  PHQ - 2 Score 0     Fall Risk: Fall Risk  06/16/2015  Falls in the past year? No     Functional Status Survey: Is the patient deaf or have difficulty hearing?: No Does the patient  have difficulty seeing, even when wearing glasses/contacts?: Yes (glasses) Does the patient have difficulty concentrating, remembering, or making decisions?: No Does the patient have difficulty walking or climbing stairs?: No Does the patient have difficulty dressing or bathing?: No Does the patient have difficulty doing errands alone such as visiting a doctor's office or shopping?: No    Assessment & Plan  1. COPD, mild ([place])  - CBC with Differential/Platelet  2. Primary osteoarthritis involving multiple joints  Using voltaren gel  3. Chronic pain  Continue follow up with pain  clinic  4. GAD (generalized anxiety disorder)  We will try adding Celexa, discussed considered therapy  - citalopram (CELEXA) 20 MG tablet; Take 1 tablet (20 mg total) by mouth daily.  Dispense: 30 tablet; Refill: 0  5. Dyslipidemia  - Lipid panel  6. Need for pneumococcal vaccination  - Pneumococcal conjugate vaccine 13-valent IM  7. Long-term use of high-risk medication  - CBC with Differential/Platelet  8. GERD without esophagitis  controlled  9. Keratitis sicca, both eyes  With joint problems, discussed referral to Rheumatologist but she wants to hold off on labs or referral because of cost  10. Leucocytosis  - CBC with Differential/Platelet  11. Encounter for tobacco use cessation counseling  Discussed possible side effects, she has taken it in the past  - varenicline (CHANTIX) 0.5 MG tablet; Take 1 tablet (0.5 mg total) by mouth 2 (two) times daily.  Dispense: 14 tablet; Refill: 0 - varenicline (CHANTIX CONTINUING MONTH PAK) 1 MG tablet; Take 1 tablet (1 mg total) by mouth 2 (two) times daily.  Dispense: 60 tablet; Refill: 2  12. Arthralgia  Continue pain clinic  13. OSA on CPAP  Continue CPAP machine every night  14. Dysphagia  - Ambulatory referral to Gastroenterology

## 2015-06-19 DIAGNOSIS — G479 Sleep disorder, unspecified: Secondary | ICD-10-CM | POA: Diagnosis not present

## 2015-06-19 DIAGNOSIS — J439 Emphysema, unspecified: Secondary | ICD-10-CM | POA: Diagnosis not present

## 2015-06-19 DIAGNOSIS — F17210 Nicotine dependence, cigarettes, uncomplicated: Secondary | ICD-10-CM | POA: Diagnosis not present

## 2015-07-01 DIAGNOSIS — D235 Other benign neoplasm of skin of trunk: Secondary | ICD-10-CM | POA: Diagnosis not present

## 2015-07-01 DIAGNOSIS — Z85828 Personal history of other malignant neoplasm of skin: Secondary | ICD-10-CM | POA: Diagnosis not present

## 2015-07-01 DIAGNOSIS — D225 Melanocytic nevi of trunk: Secondary | ICD-10-CM | POA: Diagnosis not present

## 2015-07-18 DIAGNOSIS — M791 Myalgia: Secondary | ICD-10-CM | POA: Diagnosis not present

## 2015-07-18 DIAGNOSIS — G8929 Other chronic pain: Secondary | ICD-10-CM | POA: Diagnosis not present

## 2015-07-18 DIAGNOSIS — M4726 Other spondylosis with radiculopathy, lumbar region: Secondary | ICD-10-CM | POA: Diagnosis not present

## 2015-07-18 DIAGNOSIS — Z79891 Long term (current) use of opiate analgesic: Secondary | ICD-10-CM | POA: Diagnosis not present

## 2015-07-21 ENCOUNTER — Ambulatory Visit: Payer: Medicare Other | Admitting: Family Medicine

## 2015-07-25 ENCOUNTER — Other Ambulatory Visit: Payer: Self-pay

## 2015-07-31 ENCOUNTER — Ambulatory Visit (INDEPENDENT_AMBULATORY_CARE_PROVIDER_SITE_OTHER): Payer: Medicare Other | Admitting: Gastroenterology

## 2015-07-31 ENCOUNTER — Other Ambulatory Visit: Payer: Self-pay

## 2015-07-31 ENCOUNTER — Encounter: Payer: Self-pay | Admitting: Gastroenterology

## 2015-07-31 VITALS — BP 134/74 | HR 66 | Ht 63.0 in | Wt 133.0 lb

## 2015-07-31 DIAGNOSIS — R1310 Dysphagia, unspecified: Secondary | ICD-10-CM | POA: Diagnosis not present

## 2015-07-31 NOTE — Progress Notes (Signed)
Gastroenterology Consultation  Referring Provider:     Steele Sizer, MD Primary Care Physician:  Loistine Chance, MD Primary Gastroenterologist:  Dr. Allen Norris     Reason for Consultation:     Dysphagia        HPI:   Kim Farmer is a 64 y.o. y/o female referred for consultation & management of aphasia by Dr. Loistine Chance, MD.  This patient comes in with a history of dysphagia. The patient reports that she has been told she has Barrett's esophagus. The patient had an EGD and colonoscopy 1 year ago with no stretching done at that time. The patient states she was picked up by EMS because of food getting stuck in her esophagus. The patient then reports that the food spontaneously passed before she got to the emergency department. She underwent an upper endoscopy and was told that she did not need stretching at that time. The patient states that she is now having problems with solid foods and some pills. There is no report of any surveillance for her Barrett's esophagus. The patient also was told that she doesn't need another colonoscopy for 10 years. The patient now comes to me because she recently moved to this area. There is no report of any black stools or bloody stools and she also denies any unexplained weight loss. He states in fact that she has been gaining weight.  Past Medical History  Diagnosis Date  . Chronic pain   . Hiatal hernia   . GERD (gastroesophageal reflux disease)   . Barrett esophagus   . Anxiety     Past Surgical History  Procedure Laterality Date  . Abdominal hysterectomy    . Eye surgery    . Foot surgery    . Esophageal dilation      Prior to Admission medications   Medication Sig Start Date End Date Taking? Authorizing Provider  albuterol (PROAIR HFA) 108 (90 BASE) MCG/ACT inhaler Inhale into the lungs. 05/23/15  Yes Historical Provider, MD  ALPRAZolam Duanne Moron) 1 MG tablet Take 1 mg by mouth 3 (three) times daily.   Yes Historical Provider, MD    Calcium-Vitamin D 600-200 MG-UNIT tablet Take 1 tablet by mouth daily.   Yes Historical Provider, MD  Fluticasone-Salmeterol (ADVAIR DISKUS) 250-50 MCG/DOSE AEPB Inhale 1 puff into the lungs 2 (two) times daily. 04/02/15  Yes Historical Provider, MD  Omega-3 Fatty Acids (FISH OIL) 1000 MG CAPS Take 1 capsule by mouth daily.   Yes Historical Provider, MD  omeprazole (PRILOSEC) 20 MG capsule Take by mouth. 05/26/15  Yes Historical Provider, MD  Oxycodone HCl 20 MG TABS Take 1 tablet by mouth every 4 (four) hours as needed. 04/18/15  Yes Historical Provider, MD  prednisoLONE acetate (PRED FORTE) 1 % ophthalmic suspension INSTILL 2 DROPS IN BOTH EYES Q 4 H 06/02/15  Yes Historical Provider, MD  predniSONE (DELTASONE) 10 MG tablet  04/23/15  Yes Historical Provider, MD  RESTASIS 0.05 % ophthalmic emulsion  07/17/15  Yes Historical Provider, MD  valACYclovir (VALTREX) 1000 MG tablet  03/13/15  Yes Historical Provider, MD  citalopram (CELEXA) 20 MG tablet Take 1 tablet (20 mg total) by mouth daily. Patient not taking: Reported on 07/31/2015 06/16/15   Steele Sizer, MD  fluticasone Asencion Islam) 50 MCG/ACT nasal spray Reported on 07/31/2015 03/25/15   Historical Provider, MD  triamcinolone ointment (KENALOG) 0.1 % Reported on 07/31/2015 07/01/15   Historical Provider, MD  varenicline (CHANTIX CONTINUING MONTH PAK) 1 MG tablet Take 1  tablet (1 mg total) by mouth 2 (two) times daily. Patient not taking: Reported on 07/31/2015 06/16/15   Steele Sizer, MD  varenicline (CHANTIX) 0.5 MG tablet Take 1 tablet (0.5 mg total) by mouth 2 (two) times daily. Patient not taking: Reported on 07/31/2015 06/16/15   Steele Sizer, MD  VOLTAREN 1 % GEL Apply 1 application topically daily. 05/09/15   Historical Provider, MD    Family History  Problem Relation Age of Onset  . Diabetes Mother   . Hypertension Mother   . Diabetes Father   . Hypertension Father   . COPD Sister   . Diabetes Sister   . Emphysema Sister       Social History  Substance Use Topics  . Smoking status: Current Every Day Smoker -- 1.00 packs/day for 40 years    Types: Cigarettes    Start date: 06/16/1975  . Smokeless tobacco: Never Used  . Alcohol Use: 0.0 oz/week    0 Standard drinks or equivalent per week     Comment: 2 beers today - daughter states the patient drinks regularly and heavily    Allergies as of 07/31/2015 - Review Complete 06/16/2015  Allergen Reaction Noted  . Penicillins  02/06/2015  . Sulfa antibiotics  02/06/2015    Review of Systems:    All systems reviewed and negative except where noted in HPI.   Physical Exam:  BP 134/74 mmHg  Pulse 66  Ht 5\' 3"  (1.6 m)  Wt 133 lb (60.328 kg)  BMI 23.57 kg/m2 No LMP recorded. Patient has had a hysterectomy. Psych:  Alert and cooperative. Normal mood and affect. General:   Alert,  Well-developed, well-nourished, pleasant and cooperative in NAD Head:  Normocephalic and atraumatic. Eyes:  Sclera clear, no icterus.   Conjunctiva pink. Ears:  Normal auditory acuity. Nose:  No deformity, discharge, or lesions. Mouth:  No deformity or lesions,oropharynx pink & moist. Neck:  Supple; no masses or thyromegaly. Lungs:  Respirations even and unlabored.  Clear throughout to auscultation.   No wheezes, crackles, or rhonchi. No acute distress. Heart:  Regular rate and rhythm; no murmurs, clicks, rubs, or gallops. Abdomen:  Normal bowel sounds.  No bruits.  Soft, non-tender and non-distended without masses, hepatosplenomegaly or hernias noted.  No guarding or rebound tenderness.  Negative Carnett sign.   Rectal:  Deferred.  Msk:  Symmetrical without gross deformities except for arthritic changes in her hands.  Good, equal movement & strength bilaterally. Pulses:  Normal pulses noted. Extremities:  No clubbing or edema.  No cyanosis. Neurologic:  Alert and oriented x3;  grossly normal neurologically. Skin:  Intact without significant lesions or rashes.  No jaundice. Lymph  Nodes:  No significant cervical adenopathy. Psych:  Alert and cooperative. Normal mood and affect.  Imaging Studies: No results found.  Assessment and Plan:   Kim Farmer is a 64 y.o. y/o female who comes in today with a history of dysphagia in the past with the need for dilation. The patient now reports that she is having problems swallowing. She also reports that she has a history of Barrett's esophagus. The patient will be set up for an EGD. The patient has been explained the plan and agrees with it.I have discussed risks & benefits which include, but are not limited to, bleeding, infection, perforation & drug reaction.  The patient agrees with this plan & written consent will be obtained.      Note: This dictation was prepared with Dragon dictation along with smaller phrase technology.  Any transcriptional errors that result from this process are unintentional.

## 2015-08-04 ENCOUNTER — Encounter: Payer: Self-pay | Admitting: *Deleted

## 2015-08-05 ENCOUNTER — Other Ambulatory Visit: Payer: Self-pay

## 2015-08-05 NOTE — Discharge Instructions (Signed)

## 2015-08-07 ENCOUNTER — Ambulatory Visit
Admission: RE | Admit: 2015-08-07 | Discharge: 2015-08-07 | Disposition: A | Discharge status: Home / Self Care | Payer: Medicare Other | Source: Ambulatory Visit | Attending: Gastroenterology | Admitting: Gastroenterology

## 2015-08-07 ENCOUNTER — Other Ambulatory Visit: Payer: Self-pay | Admitting: Gastroenterology

## 2015-08-07 ENCOUNTER — Ambulatory Visit: Payer: Medicare Other | Admitting: Anesthesiology

## 2015-08-07 ENCOUNTER — Encounter
Admission: RE | Disposition: A | Discharge status: Home / Self Care | Payer: Self-pay | Source: Ambulatory Visit | Attending: Gastroenterology

## 2015-08-07 ENCOUNTER — Encounter: Payer: Self-pay | Admitting: Anesthesiology

## 2015-08-07 DIAGNOSIS — R1310 Dysphagia, unspecified: Secondary | ICD-10-CM | POA: Diagnosis not present

## 2015-08-07 DIAGNOSIS — Z880 Allergy status to penicillin: Secondary | ICD-10-CM | POA: Insufficient documentation

## 2015-08-07 DIAGNOSIS — K228 Other specified diseases of esophagus: Secondary | ICD-10-CM | POA: Insufficient documentation

## 2015-08-07 DIAGNOSIS — F17210 Nicotine dependence, cigarettes, uncomplicated: Secondary | ICD-10-CM | POA: Insufficient documentation

## 2015-08-07 DIAGNOSIS — Z9889 Other specified postprocedural states: Secondary | ICD-10-CM | POA: Diagnosis not present

## 2015-08-07 DIAGNOSIS — K449 Diaphragmatic hernia without obstruction or gangrene: Secondary | ICD-10-CM | POA: Diagnosis not present

## 2015-08-07 DIAGNOSIS — J45909 Unspecified asthma, uncomplicated: Secondary | ICD-10-CM | POA: Diagnosis not present

## 2015-08-07 DIAGNOSIS — K2950 Unspecified chronic gastritis without bleeding: Secondary | ICD-10-CM | POA: Diagnosis not present

## 2015-08-07 DIAGNOSIS — M19071 Primary osteoarthritis, right ankle and foot: Secondary | ICD-10-CM | POA: Insufficient documentation

## 2015-08-07 DIAGNOSIS — K210 Gastro-esophageal reflux disease with esophagitis: Secondary | ICD-10-CM | POA: Insufficient documentation

## 2015-08-07 DIAGNOSIS — G4730 Sleep apnea, unspecified: Secondary | ICD-10-CM | POA: Insufficient documentation

## 2015-08-07 DIAGNOSIS — Z79899 Other long term (current) drug therapy: Secondary | ICD-10-CM | POA: Diagnosis not present

## 2015-08-07 DIAGNOSIS — M19072 Primary osteoarthritis, left ankle and foot: Secondary | ICD-10-CM | POA: Insufficient documentation

## 2015-08-07 DIAGNOSIS — Z882 Allergy status to sulfonamides status: Secondary | ICD-10-CM | POA: Diagnosis not present

## 2015-08-07 DIAGNOSIS — J449 Chronic obstructive pulmonary disease, unspecified: Secondary | ICD-10-CM | POA: Diagnosis not present

## 2015-08-07 DIAGNOSIS — M5030 Other cervical disc degeneration, unspecified cervical region: Secondary | ICD-10-CM | POA: Insufficient documentation

## 2015-08-07 DIAGNOSIS — Z90710 Acquired absence of both cervix and uterus: Secondary | ICD-10-CM | POA: Insufficient documentation

## 2015-08-07 DIAGNOSIS — M19042 Primary osteoarthritis, left hand: Secondary | ICD-10-CM | POA: Insufficient documentation

## 2015-08-07 DIAGNOSIS — K2289 Other specified disease of esophagus: Secondary | ICD-10-CM | POA: Insufficient documentation

## 2015-08-07 DIAGNOSIS — M19041 Primary osteoarthritis, right hand: Secondary | ICD-10-CM | POA: Insufficient documentation

## 2015-08-07 DIAGNOSIS — M479 Spondylosis, unspecified: Secondary | ICD-10-CM | POA: Diagnosis not present

## 2015-08-07 DIAGNOSIS — F419 Anxiety disorder, unspecified: Secondary | ICD-10-CM | POA: Insufficient documentation

## 2015-08-07 DIAGNOSIS — Z7951 Long term (current) use of inhaled steroids: Secondary | ICD-10-CM | POA: Diagnosis not present

## 2015-08-07 HISTORY — DX: Sciatica, unspecified side: M54.30

## 2015-08-07 HISTORY — DX: Unspecified asthma, uncomplicated: J45.909

## 2015-08-07 HISTORY — DX: Presence of dental prosthetic device (complete) (partial): Z97.2

## 2015-08-07 HISTORY — DX: Sleep apnea, unspecified: G47.30

## 2015-08-07 HISTORY — DX: Unspecified osteoarthritis, unspecified site: M19.90

## 2015-08-07 HISTORY — DX: Chronic obstructive pulmonary disease, unspecified: J44.9

## 2015-08-07 HISTORY — DX: Other intervertebral disc degeneration, lumbar region: M51.36

## 2015-08-07 HISTORY — DX: Other cervical disc degeneration, unspecified cervical region: M50.30

## 2015-08-07 HISTORY — DX: Inflammatory liver disease, unspecified: K75.9

## 2015-08-07 HISTORY — DX: Other intervertebral disc displacement, lumbar region: M51.26

## 2015-08-07 HISTORY — DX: Other intervertebral disc degeneration, lumbar region without mention of lumbar back pain or lower extremity pain: M51.369

## 2015-08-07 HISTORY — PX: ESOPHAGOGASTRODUODENOSCOPY (EGD) WITH PROPOFOL: SHX5813

## 2015-08-07 SURGERY — ESOPHAGOGASTRODUODENOSCOPY (EGD) WITH PROPOFOL
Anesthesia: Monitor Anesthesia Care

## 2015-08-07 MED ORDER — LIDOCAINE HCL (CARDIAC) 20 MG/ML IV SOLN
INTRAVENOUS | Status: DC | PRN
  Administered 2015-08-07: 40 mg via INTRAVENOUS

## 2015-08-07 MED ORDER — GLYCOPYRROLATE 0.2 MG/ML IJ SOLN
INTRAMUSCULAR | Status: DC | PRN
  Administered 2015-08-07: 0.1 mg via INTRAVENOUS

## 2015-08-07 MED ORDER — SIMETHICONE 40 MG/0.6ML PO SUSP
ORAL | Status: DC | PRN
  Administered 2015-08-07: 11:00:00

## 2015-08-07 MED ORDER — PROPOFOL 10 MG/ML IV BOLUS
INTRAVENOUS | Status: DC | PRN
  Administered 2015-08-07: 20 mg via INTRAVENOUS
  Administered 2015-08-07: 30 mg via INTRAVENOUS
  Administered 2015-08-07: 70 mg via INTRAVENOUS
  Administered 2015-08-07: 20 mg via INTRAVENOUS

## 2015-08-07 MED ORDER — LACTATED RINGERS IV SOLN
INTRAVENOUS | Status: DC
  Administered 2015-08-07: 10:00:00 via INTRAVENOUS

## 2015-08-07 SURGICAL SUPPLY — 39 items
BALLN DILATOR 10-12 8 (BALLOONS)
BALLN DILATOR 12-15 8 (BALLOONS)
BALLN DILATOR 15-18 8 (BALLOONS) ×3
BALLN DILATOR CRE 0-12 8 (BALLOONS)
BALLN DILATOR ESOPH 8 10 CRE (MISCELLANEOUS) IMPLANT
BALLOON DILATOR 12-15 8 (BALLOONS) IMPLANT
BALLOON DILATOR 15-18 8 (BALLOONS) ×1 IMPLANT
BALLOON DILATOR CRE 0-12 8 (BALLOONS) IMPLANT
BLOCK BITE 60FR ADLT L/F GRN (MISCELLANEOUS) ×3 IMPLANT
CANISTER SUCT 1200ML W/VALVE (MISCELLANEOUS) ×3 IMPLANT
FCP ESCP3.2XJMB 240X2.8X (MISCELLANEOUS)
FORCEPS BIOP RAD 4 LRG CAP 4 (CUTTING FORCEPS) IMPLANT
FORCEPS BIOP RJ4 240 W/NDL (MISCELLANEOUS)
FORCEPS ESCP3.2XJMB 240X2.8X (MISCELLANEOUS) IMPLANT
GOWN CVR UNV OPN BCK APRN NK (MISCELLANEOUS) ×2 IMPLANT
GOWN ISOL THUMB LOOP REG UNIV (MISCELLANEOUS) ×4
HEMOCLIP INSTINCT (CLIP) IMPLANT
INJECTOR VARIJECT VIN23 (MISCELLANEOUS) IMPLANT
KIT CO2 TUBING (TUBING) IMPLANT
KIT DEFENDO VALVE AND CONN (KITS) IMPLANT
KIT ENDO PROCEDURE OLY (KITS) ×3 IMPLANT
LIGATOR MULTIBAND 6SHOOTER MBL (MISCELLANEOUS) IMPLANT
MARKER SPOT ENDO TATTOO 5ML (MISCELLANEOUS) IMPLANT
PAD GROUND ADULT SPLIT (MISCELLANEOUS) IMPLANT
SNARE SHORT THROW 13M SML OVAL (MISCELLANEOUS) IMPLANT
SNARE SHORT THROW 30M LRG OVAL (MISCELLANEOUS) IMPLANT
SPOT EX ENDOSCOPIC TATTOO (MISCELLANEOUS)
SUCTION POLY TRAP 4CHAMBER (MISCELLANEOUS) IMPLANT
SYR INFLATION 60ML (SYRINGE) ×3 IMPLANT
TRAP SUCTION POLY (MISCELLANEOUS) IMPLANT
TUBING CONN 6MMX3.1M (TUBING)
TUBING SUCTION CONN 0.25 STRL (TUBING) IMPLANT
UNDERPAD 30X60 958B10 (PK) (MISCELLANEOUS) IMPLANT
VALVE BIOPSY ENDO (VALVE) IMPLANT
VARIJECT INJECTOR VIN23 (MISCELLANEOUS)
WATER AUXILLARY (MISCELLANEOUS) IMPLANT
WATER STERILE IRR 250ML POUR (IV SOLUTION) ×3 IMPLANT
WATER STERILE IRR 500ML POUR (IV SOLUTION) IMPLANT
WIRE CRE 18-20MM 8CM F G (MISCELLANEOUS) IMPLANT

## 2015-08-07 NOTE — Anesthesia Preprocedure Evaluation (Signed)
Anesthesia Evaluation  Patient identified by MRN, date of birth, ID band Patient awake    Reviewed: Allergy & Precautions, H&P , NPO status , Patient's Chart, lab work & pertinent test results, reviewed documented beta blocker date and time   Airway Mallampati: II  TM Distance: >3 FB Neck ROM: full    Dental no notable dental hx.    Pulmonary asthma , sleep apnea and Continuous Positive Airway Pressure Ventilation , COPD, Current Smoker,    Pulmonary exam normal breath sounds clear to auscultation       Cardiovascular Exercise Tolerance: Good negative cardio ROS   Rhythm:regular Rate:Normal     Neuro/Psych PSYCHIATRIC DISORDERS (anxiety)  Neuromuscular disease (cervical/lumbar radiculitis) negative psych ROS   GI/Hepatic Neg liver ROS, hiatal hernia, GERD  ,  Endo/Other  negative endocrine ROS  Renal/GU negative Renal ROS  negative genitourinary   Musculoskeletal   Abdominal   Peds  Hematology negative hematology ROS (+)   Anesthesia Other Findings   Reproductive/Obstetrics negative OB ROS                             Anesthesia Physical Anesthesia Plan  ASA: II  Anesthesia Plan: MAC   Post-op Pain Management:    Induction:   Airway Management Planned:   Additional Equipment:   Intra-op Plan:   Post-operative Plan:   Informed Consent: I have reviewed the patients History and Physical, chart, labs and discussed the procedure including the risks, benefits and alternatives for the proposed anesthesia with the patient or authorized representative who has indicated his/her understanding and acceptance.   Dental Advisory Given  Plan Discussed with: CRNA  Anesthesia Plan Comments:         Anesthesia Quick Evaluation

## 2015-08-07 NOTE — Op Note (Signed)
[facility] Gastroenterology Patient Name: Kim Farmer Procedure Date: 08/07/2015 10:50 AM MRN: [id number] Account #: [id number] Date of Birth: Mar 13, 1951 Admit Type: Outpatient Age: 64 Room: [facility] OR ROOM 01 Gender: Female Note Status: Finalized Procedure:         Upper GI endoscopy Indications:       Dysphagia Providers:         Lucilla Lame, MD Referring MD:      Bethena Roys. Sowles, MD (Referring MD) Medicines:         Propofol per Anesthesia Complications:     No immediate complications. Procedure:         Pre-Anesthesia Assessment:                    - Prior to the procedure, a History and Physical was                     performed, and patient medications and allergies were                     reviewed. The patient's tolerance of previous anesthesia                     was also reviewed. The risks and benefits of the procedure                     and the sedation options and risks were discussed with the                     patient. All questions were answered, and informed consent                     was obtained. Prior Anticoagulants: The patient has taken                     no previous anticoagulant or antiplatelet agents. ASA                     Grade Assessment: II - A patient with mild systemic                     disease. After reviewing the risks and benefits, the                     patient was deemed in satisfactory condition to undergo                     the procedure.                    After obtaining informed consent, the endoscope was passed                     under direct vision. Throughout the procedure, the                     patient's blood pressure, pulse, and oxygen saturations                     were monitored continuously. The was introduced through                     the mouth, and advanced to the second part of duodenum.  The upper GI endoscopy was accomplished without                     difficulty. The  patient tolerated the procedure well. Findings:      There were esophageal mucosal changes suspicious for Barrett's esophagus       present in the lower third of the esophagus. This was biopsied with a       cold forceps for histology. A TTS dilator was passed through the scope.       Dilation with a 15-16.5-18 mm balloon (to a maximum balloon size of 18       mm) dilator was performed. The dilation site was examined following       endoscope reinsertion and showed no change.      A small amount of food (residue) was found in the gastric antrum.      The examined duodenum was normal. Impression:        - Esophageal mucosal changes suspicious for Barrett's                     esophagus. Biopsied. Dilated.                    - A small amount of food (residue) in the stomach.                    - Normal examined duodenum. Recommendation:    - Await pathology results. Procedure Code(s): --- Professional ---                    [phone], Esophagogastroduodenoscopy, flexible, transoral;                     with transendoscopic balloon dilation of esophagus (less                     than 30 mm diameter)                    43239, Esophagogastroduodenoscopy, flexible, transoral;                     with biopsy, single or multiple Diagnosis Code(s): --- Professional ---                    R13.10, Dysphagia, unspecified                    K22.8, Other specified diseases of esophagus CPT copyright 2014 American Medical Association. All rights reserved. The codes documented in this report are preliminary and upon coder review may  be revised to meet current compliance requirements. Lucilla Lame, MD 08/07/2015 11:09:22 AM This report has been signed electronically. Number of Addenda: 0 Note Initiated On: 08/07/2015 10:50 AM Total Procedure Duration: 0 hours 5 minutes 20 seconds       [facility]

## 2015-08-07 NOTE — Transfer of Care (Signed)
Immediate Anesthesia Transfer of Care Note  Patient: Kim Farmer  Procedure(s) Performed: Procedure(s): ESOPHAGOGASTRODUODENOSCOPY (EGD) WITH PROPOFOL with balloon dilation (N/A)  Patient Location: PACU  Anesthesia Type: MAC  Level of Consciousness: awake, alert  and patient cooperative  Airway and Oxygen Therapy: Patient Spontanous Breathing and Patient connected to supplemental oxygen  Post-op Assessment: Post-op Vital signs reviewed, Patient's Cardiovascular Status Stable, Respiratory Function Stable, Patent Airway and No signs of Nausea or vomiting  Post-op Vital Signs: Reviewed and stable  Complications: No apparent anesthesia complications

## 2015-08-07 NOTE — Anesthesia Postprocedure Evaluation (Signed)
Anesthesia Post Note  Patient: Malayasia Frankenstein  Procedure(s) Performed: Procedure(s) (LRB): ESOPHAGOGASTRODUODENOSCOPY (EGD) WITH PROPOFOL with balloon dilation (N/A)  Patient location during evaluation: PACU Anesthesia Type: MAC Level of consciousness: awake and alert Pain management: pain level controlled Vital Signs Assessment: post-procedure vital signs reviewed and stable Respiratory status: spontaneous breathing, nonlabored ventilation, respiratory function stable and patient connected to nasal cannula oxygen Cardiovascular status: blood pressure returned to baseline and stable Postop Assessment: no signs of nausea or vomiting Anesthetic complications: no    Alisa Graff

## 2015-08-07 NOTE — H&P (Signed)
[facility]  [address], [place], [place] [address] Phone: [phone] Fax : [phone]  Primary Care Physician:  Loistine Chance, MD Primary Gastroenterologist:  Dr. Allen Norris  Pre-Procedure History & Physical: HPI:  Kim Farmer is a 64 y.o. female is here for an endoscopy.   Past Medical History  Diagnosis Date  . Chronic pain   . Hiatal hernia   . GERD (gastroesophageal reflux disease)   . Barrett esophagus   . Anxiety   . COPD (chronic obstructive pulmonary disease) ([place])   . Asthma   . Bulging lumbar disc   . Sciatica   . Arthritis     back, hands, feet  . Sleep apnea     CPAP  . Hepatitis     age 37 or 74. hospitalized.   . Presence of dental bridge     permanent upper  . Degenerative disc disease, cervical     limited left turn    Past Surgical History  Procedure Laterality Date  . Abdominal hysterectomy    . Eye surgery    . Foot surgery    . Esophageal dilation      Prior to Admission medications   Medication Sig Start Date End Date Taking? Authorizing Provider  albuterol (PROAIR HFA) 108 (90 BASE) MCG/ACT inhaler Inhale into the lungs. 05/23/15  Yes Historical Provider, MD  ALPRAZolam Duanne Moron) 1 MG tablet Take 1 mg by mouth 3 (three) times daily.   Yes Historical Provider, MD  Calcium-Vitamin D 600-200 MG-UNIT tablet Take 1 tablet by mouth daily.   Yes Historical Provider, MD  Fluticasone-Salmeterol (ADVAIR DISKUS) 250-50 MCG/DOSE AEPB Inhale 1 puff into the lungs 2 (two) times daily. 04/02/15  Yes Historical Provider, MD  omeprazole (PRILOSEC) 20 MG capsule Take by mouth. 05/26/15  Yes Historical Provider, MD  Oxycodone HCl 20 MG TABS Take 1 tablet by mouth every 4 (four) hours as needed. 04/18/15  Yes Historical Provider, MD  RESTASIS 0.05 % ophthalmic emulsion  07/17/15  Yes Historical Provider, MD  triamcinolone ointment (KENALOG) 0.1 % Reported on 07/31/2015 07/01/15  Yes Historical Provider, MD  valACYclovir (VALTREX) 1000 MG  tablet  03/13/15  Yes Historical Provider, MD  VOLTAREN 1 % GEL Apply 1 application topically daily. 05/09/15  Yes Historical Provider, MD  citalopram (CELEXA) 20 MG tablet Take 1 tablet (20 mg total) by mouth daily. Patient not taking: Reported on 07/31/2015 06/16/15   Steele Sizer, MD  fluticasone Asencion Islam) 50 MCG/ACT nasal spray Reported on 08/04/2015 03/25/15   Historical Provider, MD  Omega-3 Fatty Acids (FISH OIL) 1000 MG CAPS Take 1 capsule by mouth daily. Reported on 08/07/2015    Historical Provider, MD  ondansetron [facility]) 4 MG tablet Reported on 08/07/2015 05/22/15   Historical Provider, MD  prednisoLONE acetate (PRED FORTE) 1 % ophthalmic suspension Reported on 08/04/2015 06/02/15   Historical Provider, MD  predniSONE (DELTASONE) 10 MG tablet Reported on 08/07/2015 04/23/15   Historical Provider, MD  varenicline (CHANTIX CONTINUING MONTH PAK) 1 MG tablet Take 1 tablet (1 mg total) by mouth 2 (two) times daily. Patient not taking: Reported on 07/31/2015 06/16/15   Steele Sizer, MD  varenicline (CHANTIX) 0.5 MG tablet Take 1 tablet (0.5 mg total) by mouth 2 (two) times daily. Patient not taking: Reported on 07/31/2015 06/16/15   Steele Sizer, MD    Allergies as of 07/31/2015 - Review Complete 06/16/2015  Allergen Reaction Noted  . Penicillins  02/06/2015  . Sulfa antibiotics  02/06/2015  Family History  Problem Relation Age of Onset  . Diabetes Mother   . Hypertension Mother   . Diabetes Father   . Hypertension Father   . COPD Sister   . Diabetes Sister   . Emphysema Sister     Social History   Social History  . Marital Status: Single    Spouse Name: N/A  . Number of Children: N/A  . Years of Education: N/A   Occupational History  . Not on file.   Social History Main Topics  . Smoking status: Current Every Day Smoker -- 1.00 packs/day for 40 years    Types: Cigarettes    Start date: 06/16/1975  . Smokeless tobacco: Never Used  . Alcohol Use: 0.0 oz/week     0 Standard drinks or equivalent per week     Comment: 2 beers today - daughter states the patient drinks regularly and heavily  . Drug Use: No  . Sexual Activity: Not Currently   Other Topics Concern  . Not on file   Social History Narrative    Review of Systems: See HPI, otherwise negative ROS  Physical Exam: BP 144/81 mmHg  Pulse 58  Temp(Src) 97.9 F (36.6 C) (Temporal)  Resp 16  Ht 5\' 3"  (1.6 m)  Wt 131 lb (59.421 kg)  BMI 23.21 kg/m2  SpO2 100% General:   Alert,  pleasant and cooperative in NAD Head:  Normocephalic and atraumatic. Neck:  Supple; no masses or thyromegaly. Lungs:  Clear throughout to auscultation.    Heart:  Regular rate and rhythm. Abdomen:  Soft, nontender and nondistended. Normal bowel sounds, without guarding, and without rebound.   Neurologic:  Alert and  oriented x4;  grossly normal neurologically.  Impression/Plan: Kim Farmer is here for an endoscopy to be performed for dysphagia  Risks, benefits, limitations, and alternatives regarding  endoscopy have been reviewed with the patient.  Questions have been answered.  All parties agreeable.   Ollen Bowl, MD  08/07/2015, 10:27 AM

## 2015-08-07 NOTE — Anesthesia Procedure Notes (Signed)
Procedure Name: MAC Performed by: Liberty Seto Pre-anesthesia Checklist: Patient identified, Emergency Drugs available, Suction available, Timeout performed and Patient being monitored Patient Re-evaluated:Patient Re-evaluated prior to inductionOxygen Delivery Method: Nasal cannula Placement Confirmation: positive ETCO2       

## 2015-08-08 ENCOUNTER — Encounter: Payer: Self-pay | Admitting: Gastroenterology

## 2015-08-19 ENCOUNTER — Encounter: Payer: Self-pay | Admitting: Gastroenterology

## 2015-08-19 DIAGNOSIS — H16223 Keratoconjunctivitis sicca, not specified as Sjogren's, bilateral: Secondary | ICD-10-CM | POA: Diagnosis not present

## 2015-08-28 ENCOUNTER — Ambulatory Visit: Payer: Medicare Other | Admitting: Family Medicine

## 2015-10-02 ENCOUNTER — Telehealth: Payer: Self-pay

## 2015-10-02 NOTE — Telephone Encounter (Signed)
Patient called stating that she was in a lot of pain due to a fall that she had this morning when she got out of bed. She wanted to know if she did trigger point or cortisone hip injections. She stated that she has an appt on 3rd with the pain specialist. After consulting with Dr. Ancil Boozer, patient was strongly encouraged to go to the nearest ER or urgent care so she can get an x-ray to r/o a hip fracture. She was also informed that she only will consider an injection if x-ray was negative and is it truly needed based upon her assessment of the patient.   She stated that she recently went to the ER and her bill was $8000, so she could not go back b/c it is too costly.

## 2015-10-03 DIAGNOSIS — D1801 Hemangioma of skin and subcutaneous tissue: Secondary | ICD-10-CM | POA: Diagnosis not present

## 2015-10-03 DIAGNOSIS — Z08 Encounter for follow-up examination after completed treatment for malignant neoplasm: Secondary | ICD-10-CM | POA: Diagnosis not present

## 2015-10-03 DIAGNOSIS — Z85828 Personal history of other malignant neoplasm of skin: Secondary | ICD-10-CM | POA: Diagnosis not present

## 2015-10-10 ENCOUNTER — Encounter: Payer: Self-pay | Admitting: Family Medicine

## 2015-10-10 ENCOUNTER — Ambulatory Visit (INDEPENDENT_AMBULATORY_CARE_PROVIDER_SITE_OTHER): Payer: Medicare Other | Admitting: Family Medicine

## 2015-10-10 VITALS — BP 138/86 | HR 85 | Temp 98.3°F | Resp 16 | Ht 63.0 in | Wt 132.2 lb

## 2015-10-10 DIAGNOSIS — J441 Chronic obstructive pulmonary disease with (acute) exacerbation: Secondary | ICD-10-CM

## 2015-10-10 DIAGNOSIS — J302 Other seasonal allergic rhinitis: Secondary | ICD-10-CM | POA: Diagnosis not present

## 2015-10-10 DIAGNOSIS — R350 Frequency of micturition: Secondary | ICD-10-CM | POA: Diagnosis not present

## 2015-10-10 DIAGNOSIS — J069 Acute upper respiratory infection, unspecified: Secondary | ICD-10-CM | POA: Diagnosis not present

## 2015-10-10 LAB — POCT URINALYSIS DIPSTICK
BILIRUBIN UA: NEGATIVE
GLUCOSE UA: NEGATIVE
KETONES UA: NEGATIVE
Nitrite, UA: NEGATIVE
Protein, UA: NEGATIVE
SPEC GRAV UA: 1.015
Urobilinogen, UA: NEGATIVE
pH, UA: 5

## 2015-10-10 MED ORDER — CEFDINIR 300 MG PO CAPS: 300.0000 mg | ORAL_CAPSULE | Freq: Two times a day (BID) | ORAL | Status: DC

## 2015-10-10 MED ORDER — FLUTICASONE PROPIONATE 50 MCG/ACT NA SUSP: 2.0000 | Freq: Every day | NASAL | Status: DC

## 2015-10-10 MED ORDER — PREDNISONE 5 MG (48) PO TBPK: 5.0000 mg | ORAL_TABLET | Freq: Every day | ORAL | Status: DC

## 2015-10-10 NOTE — Progress Notes (Signed)
Name: Kim Farmer   MRN: [id number]    DOB: 10-15-1950   Date:10/10/2015       Progress Note  Subjective  Chief Complaint  Chief Complaint  Patient presents with  . Urinary Tract Infection    patient stated that her sx has been intermitten for almost a month. she has hesistancy, pain, pressure and nausea. patient has tried AZO but has not had any for the last 2 days.  Marland Kitchen COPD    patient has a hx of this and thinks it may be flared up  . Asthma    SOB, wheezing and tightness in her chest  . Bronchitis    patient thinks that she has bronchitis, due to productive cough and fever (100). has tried some Mucinex.  . Foot Swelling    patient noticed bilateral ankle swelling today.  . Sore Throat    patient stated her throat was very dry and raw    HPI  Dysuria: she states that for the past couple of months she has noticing that she has incomplete voiding, however over the past week she has noticed dysuria, frequency, hesitancy. AZO improved symptoms but she has been out for a couple of days. She has some low back pain, she had a temperature of 100 and has noticed some chills.  COPD exacerbation/Asthma/URI: she has noticed a sore throat, nasal congestion, rhinorrhea, followed by a cough and increase in SOB. She had some prednisone taper for 6 days and symptoms improved, but as soon as she stopped the medication symptoms are worse again. Cough is productive, sputum is yellow. Still using Advair and has been using Albuterol about 4 times day.   Foot Swelling: she has noticed some ankle swelling, she denies orthopnea. She wears her CPAP machine but states over the past few weeks she has been getting sleepy during the day and has follow up with Pulmonologist next week.    Patient Active Problem List   Diagnosis Date Noted  . Allergic rhinitis, seasonal 10/10/2015  . Other specified diseases of esophagus   . Problems with swallowing and mastication   . COPD, mild ([place]) 06/16/2015  . Cervical  radiculitis 06/16/2015  . DDD (degenerative disc disease), lumbar 06/16/2015  . Osteoarthritis 06/16/2015  . Chronic pain 06/16/2015  . Bunion 06/16/2015  . GAD (generalized anxiety disorder) 06/16/2015  . Lumbar radiculitis 06/16/2015  . Dyslipidemia 06/16/2015  . Hiatal hernia 06/16/2015  . GERD without esophagitis 06/16/2015  . 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 History of skin cancer 06/16/2015  . OSA on CPAP 06/16/2015  . Tobacco use disorder, continuous 12/17/2014  . Disturbance in sleep behavior 12/17/2014    Past Surgical History  Procedure Laterality Date  . Abdominal hysterectomy    . Eye surgery    . Foot surgery    . Esophageal dilation    . Esophagogastroduodenoscopy (egd) with propofol N/A 08/07/2015    Procedure: ESOPHAGOGASTRODUODENOSCOPY (EGD) WITH PROPOFOL with balloon dilation;  Surgeon: Lucilla Lame, MD;  Location: [facility];  Service: Endoscopy;  Laterality: N/A;    Family History  Problem Relation Age of Onset  . Diabetes Mother   . Hypertension Mother   . Diabetes Father   . Hypertension Father   . COPD Sister   . Diabetes Sister   . Emphysema Sister     Social History   Social History  . Marital Status: Single    Spouse Name: N/A  . Number of  Children: N/A  . Years of Education: N/A   Occupational History  . Not on file.   Social History Main Topics  . Smoking status: Current Every Day Smoker -- 1.00 packs/day for 40 years    Types: Cigarettes    Start date: 06/16/1975  . Smokeless tobacco: Never Used  . Alcohol Use: 0.0 oz/week    0 Standard drinks or equivalent per week     Comment: 2 beers today - daughter states the patient drinks regularly and heavily  . Drug Use: No  . Sexual Activity: Not Currently   Other Topics Concern  . Not on file   Social History Narrative     Current outpatient prescriptions:  .  albuterol (PROAIR HFA) 108 (90 BASE) MCG/ACT inhaler, Inhale  into the lungs., Disp: , Rfl:  .  ALPRAZolam (XANAX) 1 MG tablet, Take 1 mg by mouth 3 (three) times daily., Disp: , Rfl:  .  Calcium-Vitamin D 600-200 MG-UNIT tablet, Take 1 tablet by mouth daily., Disp: , Rfl:  .  cefdinir (OMNICEF) 300 MG capsule, Take 1 capsule (300 mg total) by mouth 2 (two) times daily., Disp: 14 capsule, Rfl: 0 .  fluticasone (FLONASE) 50 MCG/ACT nasal spray, Place 2 sprays into both nostrils daily., Disp: 16 g, Rfl: 5 .  Fluticasone-Salmeterol (ADVAIR DISKUS) 250-50 MCG/DOSE AEPB, Inhale 1 puff into the lungs 2 (two) times daily., Disp: , Rfl:  .  Omega-3 Fatty Acids (FISH OIL) 1000 MG CAPS, Take 1 capsule by mouth daily. Reported on 08/07/2015, Disp: , Rfl:  .  omeprazole (PRILOSEC) 20 MG capsule, Take by mouth., Disp: , Rfl:  .  ondansetron (ZOFRAN) 4 MG tablet, Reported on 08/07/2015, Disp: , Rfl: 0 .  Oxycodone HCl 20 MG TABS, Take 1 tablet by mouth every 4 (four) hours as needed., Disp: , Rfl: 0 .  prednisoLONE acetate (PRED FORTE) 1 % ophthalmic suspension, Reported on 08/04/2015, Disp: , Rfl: 0 .  RESTASIS 0.05 % ophthalmic emulsion, , Disp: , Rfl: 0 .  triamcinolone ointment (KENALOG) 0.1 %, Reported on 07/31/2015, Disp: , Rfl:  .  valACYclovir (VALTREX) 1000 MG tablet, , Disp: , Rfl: 3 .  VOLTAREN 1 % GEL, Apply 1 application topically daily., Disp: , Rfl: 1  Allergies  Allergen Reactions  . Penicillins Shortness Of Breath    Throat swells  . Sulfa Antibiotics Other (See Comments)    Unknown reaction as child     ROS  Ten systems reviewed and is negative except as mentioned in HPI   Objective  Filed Vitals:   10/10/15 1521  BP: 138/86  Pulse: 85  Temp: 98.3 F (36.8 C)  TempSrc: Oral  Resp: 16  Height: 5\' 3"  (1.6 m)  Weight: 132 lb 3.2 oz (59.966 kg)  SpO2: 97%    Body mass index is 23.42 kg/(m^2).  Physical Exam  Constitutional: Patient appears well-developed and well-nourished. No distress.  HEENT: head atraumatic, normocephalic,  pupils equal and reactive to light, ears normal TM bilaterally,  neck supple, throat within normal limits Cardiovascular: Normal rate, regular rhythm and normal heart sounds.  No murmur heard. Trace BLE edema - ankle. Pulmonary/Chest: Effort normal and breath sounds normal. No respiratory distress. Abdominal: Soft.  Mild supra pubic  tenderness. Psychiatric: Patient is anxious. behavior is normal. Judgment and thought content normal.  Recent Results (from the past 2160 hour(s))  POCT urinalysis dipstick     Status: Abnormal   Collection Time: 10/10/15  3:18 PM  Result Value Ref  Range   Color, UA yellow    Clarity, UA clear    Glucose, UA neg    Bilirubin, UA neg    Ketones, UA neg    Spec Grav, UA 1.015    Blood, UA non-hemolyzed trace    pH, UA 5.0    Protein, UA neg    Urobilinogen, UA negative    Nitrite, UA neg    Leukocytes, UA moderate (2+) (A) Negative     PHQ2/9: Depression screen [facility] 2/9 10/10/2015 06/16/2015  Decreased Interest 0 0  Down, Depressed, Hopeless 0 0  PHQ - 2 Score 0 0    Fall Risk: Fall Risk  10/10/2015 06/16/2015  Falls in the past year? No No    Functional Status Survey: Is the patient deaf or have difficulty hearing?: No Does the patient have difficulty seeing, even when wearing glasses/contacts?: No Does the patient have difficulty concentrating, remembering, or making decisions?: No Does the patient have difficulty walking or climbing stairs?: No Does the patient have difficulty dressing or bathing?: No Does the patient have difficulty doing errands alone such as visiting a doctor's office or shopping?: No    Assessment & Plan  1. Frequent urination  - POCT urinalysis dipstick - Urine culture - cefdinir (OMNICEF) 300 MG capsule; Take 1 capsule (300 mg total) by mouth 2 (two) times daily.  Dispense: 14 capsule; Refill: 0  2. Upper respiratory infection  Continue otc medication   3. COPD exacerbation ([facility])  - cefdinir (OMNICEF) 300  MG capsule; Take 1 capsule (300 mg total) by mouth 2 (two) times daily.  Dispense: 14 capsule; Refill: 0 - predniSONE (STERAPRED UNI-PAK 48 TAB) 5 MG (48) TBPK tablet; Take 1 tablet (5 mg total) by mouth daily.  Dispense: 48 tablet; Refill: 0  4. Allergic rhinitis, seasonal  - fluticasone (FLONASE) 50 MCG/ACT nasal spray; Place 2 sprays into both nostrils daily.  Dispense: 16 g; Refill: 5

## 2015-10-13 DIAGNOSIS — R0609 Other forms of dyspnea: Secondary | ICD-10-CM | POA: Diagnosis not present

## 2015-10-13 DIAGNOSIS — G4733 Obstructive sleep apnea (adult) (pediatric): Secondary | ICD-10-CM | POA: Diagnosis not present

## 2015-10-13 DIAGNOSIS — R350 Frequency of micturition: Secondary | ICD-10-CM | POA: Diagnosis not present

## 2015-10-13 DIAGNOSIS — G4710 Hypersomnia, unspecified: Secondary | ICD-10-CM | POA: Diagnosis not present

## 2015-10-13 DIAGNOSIS — R05 Cough: Secondary | ICD-10-CM | POA: Diagnosis not present

## 2015-10-14 LAB — URINE CULTURE: Organism ID, Bacteria: NO GROWTH

## 2015-10-15 ENCOUNTER — Telehealth: Payer: Self-pay

## 2015-10-15 NOTE — Telephone Encounter (Signed)
-----   Message from Steele Sizer, MD sent at 10/15/2015  8:15 AM EST ----- Negative for UTI, how is she doing?

## 2015-10-15 NOTE — Telephone Encounter (Signed)
Left message for patient to return call.

## 2015-10-17 DIAGNOSIS — Z79891 Long term (current) use of opiate analgesic: Secondary | ICD-10-CM | POA: Diagnosis not present

## 2015-10-17 DIAGNOSIS — G8929 Other chronic pain: Secondary | ICD-10-CM | POA: Diagnosis not present

## 2015-10-17 DIAGNOSIS — M4726 Other spondylosis with radiculopathy, lumbar region: Secondary | ICD-10-CM | POA: Diagnosis not present

## 2015-10-17 DIAGNOSIS — M791 Myalgia: Secondary | ICD-10-CM | POA: Diagnosis not present

## 2015-10-28 ENCOUNTER — Other Ambulatory Visit: Payer: Self-pay | Admitting: Family Medicine

## 2015-10-28 ENCOUNTER — Ambulatory Visit
Admission: RE | Admit: 2015-10-28 | Discharge: 2015-10-28 | Disposition: A | Discharge status: Home / Self Care | Payer: Medicare Other | Source: Ambulatory Visit | Attending: Family Medicine | Admitting: Family Medicine

## 2015-10-28 ENCOUNTER — Other Ambulatory Visit: Payer: Self-pay

## 2015-10-28 ENCOUNTER — Encounter: Payer: Self-pay | Admitting: Family Medicine

## 2015-10-28 ENCOUNTER — Ambulatory Visit (INDEPENDENT_AMBULATORY_CARE_PROVIDER_SITE_OTHER): Payer: Medicare Other | Admitting: Family Medicine

## 2015-10-28 VITALS — BP 118/76 | HR 78 | Temp 98.6°F | Resp 16 | Ht 63.0 in | Wt 134.6 lb

## 2015-10-28 DIAGNOSIS — Z114 Encounter for screening for human immunodeficiency virus [HIV]: Secondary | ICD-10-CM

## 2015-10-28 DIAGNOSIS — Z1159 Encounter for screening for other viral diseases: Secondary | ICD-10-CM

## 2015-10-28 DIAGNOSIS — J441 Chronic obstructive pulmonary disease with (acute) exacerbation: Secondary | ICD-10-CM

## 2015-10-28 DIAGNOSIS — R911 Solitary pulmonary nodule: Secondary | ICD-10-CM | POA: Insufficient documentation

## 2015-10-28 DIAGNOSIS — J449 Chronic obstructive pulmonary disease, unspecified: Secondary | ICD-10-CM

## 2015-10-28 DIAGNOSIS — Z8719 Personal history of other diseases of the digestive system: Secondary | ICD-10-CM | POA: Insufficient documentation

## 2015-10-28 DIAGNOSIS — R600 Localized edema: Secondary | ICD-10-CM | POA: Diagnosis not present

## 2015-10-28 DIAGNOSIS — R1111 Vomiting without nausea: Secondary | ICD-10-CM | POA: Diagnosis not present

## 2015-10-28 DIAGNOSIS — R0602 Shortness of breath: Secondary | ICD-10-CM | POA: Diagnosis not present

## 2015-10-28 DIAGNOSIS — R05 Cough: Secondary | ICD-10-CM | POA: Diagnosis not present

## 2015-10-28 DIAGNOSIS — Z9989 Dependence on other enabling machines and devices: Secondary | ICD-10-CM

## 2015-10-28 DIAGNOSIS — E785 Hyperlipidemia, unspecified: Secondary | ICD-10-CM

## 2015-10-28 DIAGNOSIS — R21 Rash and other nonspecific skin eruption: Secondary | ICD-10-CM | POA: Diagnosis not present

## 2015-10-28 DIAGNOSIS — G4733 Obstructive sleep apnea (adult) (pediatric): Secondary | ICD-10-CM | POA: Diagnosis not present

## 2015-10-28 DIAGNOSIS — N760 Acute vaginitis: Secondary | ICD-10-CM

## 2015-10-28 MED ORDER — MOXIFLOXACIN HCL 400 MG PO TABS: 400.0000 mg | ORAL_TABLET | Freq: Every day | ORAL | Status: DC

## 2015-10-28 MED ORDER — CITALOPRAM HYDROBROMIDE 20 MG PO TABS: 20.0000 mg | ORAL_TABLET | Freq: Every day | ORAL | Status: DC

## 2015-10-28 MED ORDER — FLUCONAZOLE 150 MG PO TABS: 150.0000 mg | ORAL_TABLET | ORAL | Status: DC

## 2015-10-28 NOTE — Progress Notes (Signed)
Name: Kim Farmer   MRN: [id number]    DOB: 1951-06-26   Date:10/28/2015       Progress Note  Subjective  Chief Complaint  Chief Complaint  Patient presents with  . COPD    patient has been having productive cough with "thick nasty sputum"  . Wheezing  . Shortness of Breath  . Fatigue  . Rash    patient used her sister's electric razor and afterwards she had bilateral ankle edema, redness, burning, itching and some bleeding.    HPI  COPD exacerbation: she had an URI on Feb 2016, that got progressively worse, seen in our office on 2/24 and was given a prednisone taper and Cefdinir, symptoms improved for a few days but worse again. She still has a productive cough, SOB with activity. She is on Advair daily and is using Albuterol 5-6 times daily to control symptoms. She has been feeling very tired lately.   Rash: on both lower extremities after she used an electric raiser to shave her legs, symptoms started immediately after shaving, noticed some bleeding, and dry skin since with some petechia on the site of previous bleeding. That happened one week ago and is resolving now.   Foot Swelling: she has noticed some ankle swelling, she denies orthopnea, but has SOB with activity, her sister died with complications of CHF. Marland Kitchen She wears her CPAP machine but states over the past few weeks she has been getting sleepy during the day, she has seen Dr. Raul Farmer and is now on Provigil, she has been doing better since started on that.   Vaginitis: she states since she took antibiotics she has noticed vaginal itching and would like a prescription for yeast infection   Patient Active Problem List   Diagnosis Date Noted  . Allergic rhinitis, seasonal 10/10/2015  . Other specified diseases of esophagus   . Problems with swallowing and mastication   . COPD, mild ([place]) 06/16/2015  . Cervical radiculitis 06/16/2015  . DDD (degenerative disc disease), lumbar 06/16/2015  . Osteoarthritis 06/16/2015  .  Chronic pain 06/16/2015  . Bunion 06/16/2015  . GAD (generalized anxiety disorder) 06/16/2015  . Lumbar radiculitis 06/16/2015  . Dyslipidemia 06/16/2015  . Hiatal hernia 06/16/2015  . GERD without esophagitis 06/16/2015  . 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 History of skin cancer 06/16/2015  . OSA on CPAP 06/16/2015  . Tobacco use disorder, continuous 12/17/2014  . Disturbance in sleep behavior 12/17/2014    Past Surgical History  Procedure Laterality Date  . Abdominal hysterectomy    . Eye surgery    . Foot surgery    . Esophageal dilation    . Esophagogastroduodenoscopy (egd) with propofol N/A 08/07/2015    Procedure: ESOPHAGOGASTRODUODENOSCOPY (EGD) WITH PROPOFOL with balloon dilation;  Surgeon: Lucilla Lame, MD;  Location: [facility];  Service: Endoscopy;  Laterality: N/A;    Family History  Problem Relation Age of Onset  . Diabetes Mother   . Hypertension Mother   . Diabetes Father   . Hypertension Father   . COPD Sister   . Diabetes Sister   . Emphysema Sister     Social History   Social History  . Marital Status: Single    Spouse Name: N/A  . Number of Children: N/A  . Years of Education: N/A   Occupational History  . Not on file.   Social History Main Topics  . Smoking status: Current Every Day Smoker -- 1.00 packs/day  for 40 years    Types: Cigarettes    Start date: 06/16/1975  . Smokeless tobacco: Never Used  . Alcohol Use: 0.0 oz/week    0 Standard drinks or equivalent per week     Comment: 2 beers today - daughter states the patient drinks regularly and heavily  . Drug Use: No  . Sexual Activity: Not Currently   Other Topics Concern  . Not on file   Social History Narrative     Current outpatient prescriptions:  .  albuterol (PROAIR HFA) 108 (90 BASE) MCG/ACT inhaler, Inhale into the lungs., Disp: , Rfl:  .  ALPRAZolam (XANAX) 1 MG tablet, Take 1 mg by mouth 3 (three) times daily.,  Disp: , Rfl:  .  Armodafinil 200 MG TABS, TK 1 T PO QD, Disp: , Rfl: 5 .  Calcium-Vitamin D 600-200 MG-UNIT tablet, Take 1 tablet by mouth daily., Disp: , Rfl:  .  citalopram (CELEXA) 20 MG tablet, Take 1 tablet (20 mg total) by mouth daily., Disp: 30 tablet, Rfl: 0 .  fluticasone (FLONASE) 50 MCG/ACT nasal spray, Place 2 sprays into both nostrils daily., Disp: 16 g, Rfl: 5 .  Fluticasone-Salmeterol (ADVAIR DISKUS) 250-50 MCG/DOSE AEPB, Inhale 1 puff into the lungs 2 (two) times daily., Disp: , Rfl:  .  moxifloxacin (AVELOX) 400 MG tablet, Take 1 tablet (400 mg total) by mouth daily., Disp: 7 tablet, Rfl: 0 .  Omega-3 Fatty Acids (FISH OIL) 1000 MG CAPS, Take 1 capsule by mouth daily. Reported on 08/07/2015, Disp: , Rfl:  .  omeprazole (PRILOSEC) 20 MG capsule, Take by mouth., Disp: , Rfl:  .  ondansetron (ZOFRAN) 4 MG tablet, Reported on 08/07/2015, Disp: , Rfl: 0 .  Oxycodone HCl 20 MG TABS, Take 1 tablet by mouth every 4 (four) hours as needed., Disp: , Rfl: 0 .  prednisoLONE acetate (PRED FORTE) 1 % ophthalmic suspension, Reported on 08/04/2015, Disp: , Rfl: 0 .  RESTASIS 0.05 % ophthalmic emulsion, , Disp: , Rfl: 0 .  triamcinolone ointment (KENALOG) 0.1 %, Reported on 07/31/2015, Disp: , Rfl:  .  valACYclovir (VALTREX) 1000 MG tablet, , Disp: , Rfl: 3 .  VOLTAREN 1 % GEL, Apply 1 application topically daily., Disp: , Rfl: 1  Allergies  Allergen Reactions  . Penicillins Shortness Of Breath    Throat swells  . Sulfa Antibiotics Other (See Comments)    Unknown reaction as child     ROS  Ten systems reviewed and is negative except as mentioned in HPI   Objective  Filed Vitals:   10/28/15 1135  BP: 118/76  Pulse: 78  Temp: 98.6 F (37 C)  TempSrc: Oral  Resp: 16  Height: 5\' 3"  (1.6 m)  Weight: 134 lb 9.6 oz (61.054 kg)  SpO2: 97%    Body mass index is 23.85 kg/(m^2).  Physical Exam  Constitutional: Patient appears well-developed and well-nourished.  No distress.   HEENT: head atraumatic, normocephalic, pupils equal and reactive to light, neck supple Cardiovascular: Normal rate, regular rhythm and normal heart sounds.  No murmur heard. trace  BLE edema - around ankles. Pulmonary/Chest: Effort normal, she has diffuse rhonchi, no crackles Abdominal: Soft.  There is no tenderness. Psychiatric: Patient has a normal mood and affect. behavior is normal. Judgment and thought content normal.  Recent Results (from the past 2160 hour(s))  POCT urinalysis dipstick     Status: Abnormal   Collection Time: 10/10/15  3:18 PM  Result Value Ref Range   Color, UA  yellow    Clarity, UA clear    Glucose, UA neg    Bilirubin, UA neg    Ketones, UA neg    Spec Grav, UA 1.015    Blood, UA non-hemolyzed trace    pH, UA 5.0    Protein, UA neg    Urobilinogen, UA negative    Nitrite, UA neg    Leukocytes, UA moderate (2+) (A) Negative  Urine culture     Status: None   Collection Time: 10/13/15 12:00 AM  Result Value Ref Range   Urine Culture, Routine Final report    Urine Culture result 1 No growth      PHQ2/9: Depression screen [facility] 2/9 10/28/2015 10/10/2015 06/16/2015  Decreased Interest 0 0 0  Down, Depressed, Hopeless 0 0 0  PHQ - 2 Score 0 0 0     Fall Risk: Fall Risk  10/28/2015 10/10/2015 06/16/2015  Falls in the past year? No No No      Functional Status Survey: Is the patient deaf or have difficulty hearing?: No Does the patient have difficulty seeing, even when wearing glasses/contacts?: No Does the patient have difficulty concentrating, remembering, or making decisions?: No Does the patient have difficulty walking or climbing stairs?: No Does the patient have difficulty dressing or bathing?: No Does the patient have difficulty doing errands alone such as visiting a doctor's office or shopping?: No    Assessment & Plan  1. COPD exacerbation ([place])  Check CXR, try stronger antibiotics, needs to follow up with Dr. Raul Farmer - she has an  appointment with him next week. Sees Dr. Raul Farmer, discussed Spiriva and she will talk to him about it - DG Chest 2 View; Future - moxifloxacin (AVELOX) 400 MG tablet; Take 1 tablet (400 mg total) by mouth daily.  Dispense: 7 tablet; Refill: 0 - CBC with Differential/Platelet  2. Rash  resolving  3. OSA on CPAP  Continue CPAP and Provigil and follow up with Dr. Raul Farmer  4. Dyslipidemia  - Lipid panel  5. Need for hepatitis C screening test  - Hepatitis C antibody  6. Encounter for screening for HIV  Refused because of cost  7. Non-intractable vomiting without nausea, unspecified vomiting type  - Comprehensive metabolic panel  8. History of esophageal stricture  Dilation done by Dr. Durwin Reges  9. Bilateral lower extremity edema  - Brain natriuretic peptide  10. SOB (shortness of breath)  - Brain natriuretic peptide  11. Vaginitis  - fluconazole (DIFLUCAN) 150 MG tablet; Take 1 tablet (150 mg total) by mouth every other day.  Dispense: 3 tablet; Refill: 0

## 2015-10-29 ENCOUNTER — Other Ambulatory Visit: Payer: Self-pay | Admitting: Family Medicine

## 2015-10-29 DIAGNOSIS — E871 Hypo-osmolality and hyponatremia: Secondary | ICD-10-CM

## 2015-10-29 LAB — CBC WITH DIFFERENTIAL/PLATELET
BASOS: 0 %
Basophils Absolute: 0 10*3/uL (ref 0.0–0.2)
EOS (ABSOLUTE): 0.2 10*3/uL (ref 0.0–0.4)
Eos: 2 %
HEMOGLOBIN: 12.5 g/dL (ref 11.1–15.9)
Hematocrit: 36 % (ref 34.0–46.6)
IMMATURE GRANS (ABS): 0 10*3/uL (ref 0.0–0.1)
Immature Granulocytes: 0 %
LYMPHS: 16 %
Lymphocytes Absolute: 1.5 10*3/uL (ref 0.7–3.1)
MCH: 31.8 pg (ref 26.6–33.0)
MCHC: 34.7 g/dL (ref 31.5–35.7)
MCV: 92 fL (ref 79–97)
MONOCYTES: 8 %
Monocytes Absolute: 0.8 10*3/uL (ref 0.1–0.9)
NEUTROS ABS: 6.9 10*3/uL (ref 1.4–7.0)
Neutrophils: 74 %
PLATELETS: 296 10*3/uL (ref 150–379)
RBC: 3.93 x10E6/uL (ref 3.77–5.28)
RDW: 13.3 % (ref 12.3–15.4)
WBC: 9.4 10*3/uL (ref 3.4–10.8)

## 2015-10-29 LAB — COMPREHENSIVE METABOLIC PANEL
A/G RATIO: 1.7 (ref 1.2–2.2)
ALBUMIN: 3.8 g/dL (ref 3.6–4.8)
ALT: 25 IU/L (ref 0–32)
AST: 22 IU/L (ref 0–40)
Alkaline Phosphatase: 119 IU/L — ABNORMAL HIGH (ref 39–117)
BILIRUBIN TOTAL: 0.3 mg/dL (ref 0.0–1.2)
BUN / CREAT RATIO: 13 (ref 11–26)
BUN: 8 mg/dL (ref 8–27)
CHLORIDE: 89 mmol/L — AB (ref 96–106)
CO2: 28 mmol/L (ref 18–29)
Calcium: 8.7 mg/dL (ref 8.7–10.3)
Creatinine, Ser: 0.62 mg/dL (ref 0.57–1.00)
GFR calc non Af Amer: 95 mL/min/{1.73_m2} (ref >59)
GFR, EST AFRICAN AMERICAN: 109 mL/min/{1.73_m2} (ref >59)
Globulin, Total: 2.2 g/dL (ref 1.5–4.5)
Glucose: 107 mg/dL — ABNORMAL HIGH (ref 65–99)
POTASSIUM: 4.6 mmol/L (ref 3.5–5.2)
SODIUM: 130 mmol/L — AB (ref 134–144)
TOTAL PROTEIN: 6 g/dL (ref 6.0–8.5)

## 2015-10-29 LAB — LIPID PANEL
CHOL/HDL RATIO: 4.2 ratio (ref 0.0–4.4)
Cholesterol, Total: 194 mg/dL (ref 100–199)
HDL: 46 mg/dL (ref >39)
LDL CALC: 122 mg/dL — AB (ref 0–99)
TRIGLYCERIDES: 128 mg/dL (ref 0–149)
VLDL Cholesterol Cal: 26 mg/dL (ref 5–40)

## 2015-10-29 LAB — BRAIN NATRIURETIC PEPTIDE: BNP: 91 pg/mL (ref 0.0–100.0)

## 2015-10-29 LAB — HEPATITIS C ANTIBODY: Hep C Virus Ab: <0.1 s/co ratio (ref 0.0–0.9)

## 2015-11-04 ENCOUNTER — Encounter: Payer: Self-pay | Admitting: Family Medicine

## 2015-11-04 ENCOUNTER — Ambulatory Visit: Admission: RE | Admit: 2015-11-04 | Payer: Medicare Other | Source: Ambulatory Visit

## 2015-11-04 ENCOUNTER — Ambulatory Visit (INDEPENDENT_AMBULATORY_CARE_PROVIDER_SITE_OTHER): Payer: Medicare Other | Admitting: Family Medicine

## 2015-11-04 ENCOUNTER — Ambulatory Visit
Admission: RE | Admit: 2015-11-04 | Discharge: 2015-11-04 | Disposition: A | Discharge status: Home / Self Care | Payer: Medicare Other | Source: Ambulatory Visit | Attending: Family Medicine | Admitting: Family Medicine

## 2015-11-04 VITALS — BP 122/60 | HR 66 | Temp 98.0°F | Resp 16 | Ht 63.0 in | Wt 131.8 lb

## 2015-11-04 DIAGNOSIS — I2510 Atherosclerotic heart disease of native coronary artery without angina pectoris: Secondary | ICD-10-CM

## 2015-11-04 DIAGNOSIS — R05 Cough: Secondary | ICD-10-CM | POA: Insufficient documentation

## 2015-11-04 DIAGNOSIS — J439 Emphysema, unspecified: Secondary | ICD-10-CM | POA: Insufficient documentation

## 2015-11-04 DIAGNOSIS — R918 Other nonspecific abnormal finding of lung field: Secondary | ICD-10-CM | POA: Diagnosis not present

## 2015-11-04 DIAGNOSIS — Z720 Tobacco use: Secondary | ICD-10-CM | POA: Diagnosis not present

## 2015-11-04 DIAGNOSIS — F17200 Nicotine dependence, unspecified, uncomplicated: Secondary | ICD-10-CM | POA: Insufficient documentation

## 2015-11-04 DIAGNOSIS — J432 Centrilobular emphysema: Secondary | ICD-10-CM | POA: Diagnosis not present

## 2015-11-04 DIAGNOSIS — F17209 Nicotine dependence, unspecified, with unspecified nicotine-induced disorders: Secondary | ICD-10-CM

## 2015-11-04 DIAGNOSIS — R911 Solitary pulmonary nodule: Secondary | ICD-10-CM | POA: Diagnosis present

## 2015-11-04 MED ORDER — ASPIRIN EC 81 MG PO TBEC: 81.0000 mg | DELAYED_RELEASE_TABLET | Freq: Every day | ORAL | Status: AC

## 2015-11-04 MED ORDER — TIOTROPIUM BROMIDE MONOHYDRATE 18 MCG IN CAPS: 18.0000 ug | ORAL_CAPSULE | Freq: Every day | RESPIRATORY_TRACT | Status: DC

## 2015-11-04 MED ORDER — ATORVASTATIN CALCIUM 40 MG PO TABS: 40.0000 mg | ORAL_TABLET | Freq: Every day | ORAL | Status: DC

## 2015-11-04 NOTE — Progress Notes (Signed)
Name: Kim Farmer   MRN: [id number]    DOB: 1950/10/14   Date:11/04/2015       Progress Note  Subjective  Chief Complaint  Chief Complaint  Patient presents with  . Follow-up    Follow-up on CT Scan    HPI  Abnormal CT : she was given Levaquin last visit, coughing has improved, still productive. CT showed coronary calcification of two vessels, also parenchymal disease and centrilobular emphysema. She is still smoking, and explained importance of stopping smoking. She has SOB for the past week and it has been unchanged.    Patient Active Problem List   Diagnosis Date Noted  . Coronary artery calcification seen on CT scan 11/04/2015  . History of esophageal stricture 10/28/2015  . Lung nodule, solitary 10/28/2015  . Allergic rhinitis, seasonal 10/10/2015  . Other specified diseases of esophagus   . Problems with swallowing and mastication   . Centrilobular emphysema ([place]) 06/16/2015  . Cervical radiculitis 06/16/2015  . DDD (degenerative disc disease), lumbar 06/16/2015  . Osteoarthritis 06/16/2015  . Chronic pain 06/16/2015  . Bunion 06/16/2015  . GAD (generalized anxiety disorder) 06/16/2015  . Lumbar radiculitis 06/16/2015  . Dyslipidemia 06/16/2015  . Hiatal hernia 06/16/2015  . GERD without esophagitis 06/16/2015  . 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 History of skin cancer 06/16/2015  . OSA on CPAP 06/16/2015  . Tobacco use disorder, continuous 12/17/2014  . Disturbance in sleep behavior 12/17/2014    Past Surgical History  Procedure Laterality Date  . Abdominal hysterectomy    . Eye surgery    . Foot surgery    . Esophageal dilation    . Esophagogastroduodenoscopy (egd) with propofol N/A 08/07/2015    Procedure: ESOPHAGOGASTRODUODENOSCOPY (EGD) WITH PROPOFOL with balloon dilation;  Surgeon: Lucilla Lame, MD;  Location: [facility];  Service: Endoscopy;  Laterality: N/A;    Family History  Problem Relation  Age of Onset  . Diabetes Mother   . Hypertension Mother   . Diabetes Father   . Hypertension Father   . COPD Sister   . Diabetes Sister   . Emphysema Sister     Social History   Social History  . Marital Status: Single    Spouse Name: N/A  . Number of Children: N/A  . Years of Education: N/A   Occupational History  . Not on file.   Social History Main Topics  . Smoking status: Current Every Day Smoker -- 1.00 packs/day for 40 years    Types: Cigarettes    Start date: 06/16/1975  . Smokeless tobacco: Never Used  . Alcohol Use: 0.0 oz/week    0 Standard drinks or equivalent per week     Comment: 2 beers today - daughter states the patient drinks regularly and heavily  . Drug Use: No  . Sexual Activity: Not Currently   Other Topics Concern  . Not on file   Social History Narrative     Current outpatient prescriptions:  .  albuterol (PROAIR HFA) 108 (90 BASE) MCG/ACT inhaler, Inhale into the lungs., Disp: , Rfl:  .  ALPRAZolam (XANAX) 1 MG tablet, Take 1 mg by mouth 3 (three) times daily., Disp: , Rfl:  .  Armodafinil 200 MG TABS, TK 1 T PO QD, Disp: , Rfl: 5 .  Calcium-Vitamin D 600-200 MG-UNIT tablet, Take 1 tablet by mouth daily., Disp: , Rfl:  .  citalopram (CELEXA) 20 MG tablet, Take 1 tablet (20 mg total)  by mouth daily., Disp: 30 tablet, Rfl: 0 .  fluticasone (FLONASE) 50 MCG/ACT nasal spray, Place 2 sprays into both nostrils daily., Disp: 16 g, Rfl: 5 .  Fluticasone-Salmeterol (ADVAIR DISKUS) 250-50 MCG/DOSE AEPB, Inhale 1 puff into the lungs 2 (two) times daily., Disp: , Rfl:  .  Omega-3 Fatty Acids (FISH OIL) 1000 MG CAPS, Take 1 capsule by mouth daily. Reported on 08/07/2015, Disp: , Rfl:  .  omeprazole (PRILOSEC) 20 MG capsule, Take by mouth., Disp: , Rfl:  .  ondansetron (ZOFRAN) 4 MG tablet, Reported on 08/07/2015, Disp: , Rfl: 0 .  Oxycodone HCl 20 MG TABS, Take 1 tablet by mouth every 4 (four) hours as needed., Disp: , Rfl: 0 .  prednisoLONE acetate  (PRED FORTE) 1 % ophthalmic suspension, Reported on 08/04/2015, Disp: , Rfl: 0 .  RESTASIS 0.05 % ophthalmic emulsion, , Disp: , Rfl: 0 .  valACYclovir (VALTREX) 1000 MG tablet, , Disp: , Rfl: 3 .  aspirin EC 81 MG tablet, Take 1 tablet (81 mg total) by mouth daily., Disp: 30 tablet, Rfl: 0 .  atorvastatin (LIPITOR) 40 MG tablet, Take 1 tablet (40 mg total) by mouth daily., Disp: 30 tablet, Rfl: 3 .  levofloxacin (LEVAQUIN) 500 MG tablet, Take 500 mg by mouth daily. Reported on 11/04/2015, Disp: , Rfl:  .  triamcinolone ointment (KENALOG) 0.1 %, Reported on 11/04/2015, Disp: , Rfl:  .  VOLTAREN 1 % GEL, Apply 1 application topically daily. Reported on 11/04/2015, Disp: , Rfl: 1  Allergies  Allergen Reactions  . Penicillins Shortness Of Breath    Throat swells  . Sulfa Antibiotics Other (See Comments)    Unknown reaction as child     ROS  Ten systems reviewed and is negative except as mentioned in HPI   Objective  Filed Vitals:   11/04/15 1533  BP: 122/60  Pulse: 66  Temp: 98 F (36.7 C)  TempSrc: Oral  Resp: 16  Height: 5\' 3"  (1.6 m)  Weight: 131 lb 12.8 oz (59.784 kg)  SpO2: 95%    Body mass index is 23.35 kg/(m^2).  Physical Exam   Constitutional: Patient appears well-developed and well-nourished. No distress.  HEENT: head atraumatic, normocephalic, pupils equal and reactive to light, neck supple Cardiovascular: Normal rate, regular rhythm and normal heart sounds. No murmur heard. No BLE edema  Pulmonary/Chest: Effort normal, she has diffuse rhonchi, no crackles Abdominal: Soft. There is no tenderness. Psychiatric: Patient has a normal mood and affect. behavior is normal. Judgment and thought    Recent Results (from the past 2160 hour(s))  POCT urinalysis dipstick     Status: Abnormal   Collection Time: 10/10/15  3:18 PM  Result Value Ref Range   Color, UA yellow    Clarity, UA clear    Glucose, UA neg    Bilirubin, UA neg    Ketones, UA neg    Spec Grav,  UA 1.015    Blood, UA non-hemolyzed trace    pH, UA 5.0    Protein, UA neg    Urobilinogen, UA negative    Nitrite, UA neg    Leukocytes, UA moderate (2+) (A) Negative  Urine culture     Status: None   Collection Time: 10/13/15 12:00 AM  Result Value Ref Range   Urine Culture, Routine Final report    Urine Culture result 1 No growth   Lipid panel     Status: Abnormal   Collection Time: 10/28/15 12:36 PM  Result Value Ref Range  Cholesterol, Total 194 100 - 199 mg/dL   Triglycerides 128 0 - 149 mg/dL   HDL 46 >39 mg/dL   VLDL Cholesterol Cal 26 5 - 40 mg/dL   LDL Calculated 122 (H) 0 - 99 mg/dL   Chol/HDL Ratio 4.2 0.0 - 4.4 ratio units    Comment:                                   T. Chol/HDL Ratio                                             Men  Women                               1/2 Avg.Risk  3.4    3.3                                   Avg.Risk  5.0    4.4                                2X Avg.Risk  9.6    7.1                                3X Avg.Risk 23.4   11.0   CBC with Differential/Platelet     Status: None   Collection Time: 10/28/15 12:36 PM  Result Value Ref Range   WBC 9.4 3.4 - 10.8 x10E3/uL   RBC 3.93 3.77 - 5.28 x10E6/uL   Hemoglobin 12.5 11.1 - 15.9 g/dL   Hematocrit 36.0 34.0 - 46.6 %   MCV 92 79 - 97 fL   MCH 31.8 26.6 - 33.0 pg   MCHC 34.7 31.5 - 35.7 g/dL   RDW 13.3 12.3 - 15.4 %   Platelets 296 150 - 379 x10E3/uL   Neutrophils 74 %   Lymphs 16 %   Monocytes 8 %   Eos 2 %   Basos 0 %   Neutrophils Absolute 6.9 1.4 - 7.0 x10E3/uL   Lymphocytes Absolute 1.5 0.7 - 3.1 x10E3/uL   Monocytes Absolute 0.8 0.1 - 0.9 x10E3/uL   EOS (ABSOLUTE) 0.2 0.0 - 0.4 x10E3/uL   Basophils Absolute 0.0 0.0 - 0.2 x10E3/uL   Immature Granulocytes 0 %   Immature Grans (Abs) 0.0 0.0 - 0.1 x10E3/uL  Comprehensive metabolic panel     Status: Abnormal   Collection Time: 10/28/15 12:36 PM  Result Value Ref Range   Glucose 107 (H) 65 - 99 mg/dL   BUN 8 8 - 27 mg/dL    Creatinine, Ser 0.62 0.57 - 1.00 mg/dL   GFR calc non Af Amer 95 >59 mL/min/1.73   GFR calc Af Amer 109 >59 mL/min/1.73   BUN/Creatinine Ratio 13 11 - 26   Sodium 130 (L) 134 - 144 mmol/L   Potassium 4.6 3.5 - 5.2 mmol/L   Chloride 89 (L) 96 - 106 mmol/L   CO2 28 18 - 29 mmol/L   Calcium 8.7 8.7 - 10.3 mg/dL   Total Protein 6.0 6.0 -  8.5 g/dL   Albumin 3.8 3.6 - 4.8 g/dL   Globulin, Total 2.2 1.5 - 4.5 g/dL   Albumin/Globulin Ratio 1.7 1.2 - 2.2    Comment:               **Please note reference interval change**   Bilirubin Total 0.3 0.0 - 1.2 mg/dL   Alkaline Phosphatase 119 (H) 39 - 117 IU/L   AST 22 0 - 40 IU/L   ALT 25 0 - 32 IU/L  Hepatitis C antibody     Status: None   Collection Time: 10/28/15 12:36 PM  Result Value Ref Range   Hep C Virus Ab <0.1 0.0 - 0.9 s/co ratio    Comment:                                   Negative:     < 0.8                              Indeterminate: 0.8 - 0.9                                   Positive:     > 0.9  The CDC recommends that a positive HCV antibody result  be followed up with a HCV Nucleic Acid Amplification  test [id number]).   Brain natriuretic peptide     Status: None   Collection Time: 10/28/15 12:36 PM  Result Value Ref Range   BNP 91.0 0.0 - 100.0 pg/mL    PHQ2/9: Depression screen [facility] 2/9 11/04/2015 10/28/2015 10/10/2015 06/16/2015  Decreased Interest 0 0 0 0  Down, Depressed, Hopeless 0 0 0 0  PHQ - 2 Score 0 0 0 0    Fall Risk: Fall Risk  11/04/2015 10/28/2015 10/10/2015 06/16/2015  Falls in the past year? No No No No    Assessment & Plan  1. Centrilobular emphysema ([facility])  - tiotropium (SPIRIVA HANDIHALER) 18 MCG inhalation capsule; Place 1 capsule (18 mcg total) into inhaler and inhale daily.  Dispense: 30 capsule; Refill: 3  2. Coronary artery calcification seen on CT scan  She can't afford seeing a cardiologist at this time, but will let me know when she is ready to go - aspirin EC 81 MG tablet; Take 1 tablet (81  mg total) by mouth daily.  Dispense: 30 tablet; Refill: 0 - atorvastatin (LIPITOR) 40 MG tablet; Take 1 tablet (40 mg total) by mouth daily.  Dispense: 30 tablet; Refill: 3  3. Tobacco use disorder, continuous  She started the Chantix but she stopped using and will resume it again now

## 2015-11-05 ENCOUNTER — Telehealth: Payer: Self-pay

## 2015-11-05 MED ORDER — VARENICLINE TARTRATE 0.5 MG X 11 & 1 MG X 42 PO MISC: ORAL | Status: DC

## 2015-11-05 NOTE — Telephone Encounter (Signed)
Patient called and states she needs to stop smoking due to her health conditions and has a refill of the medication you gave her for it, but she does not have the starter kit. So since she started the high dose of the medication to stop smoking it has made her very nausea and is asking for the starting dose to be sent into her pharmacy. Thanks

## 2015-11-06 ENCOUNTER — Other Ambulatory Visit: Payer: Self-pay

## 2015-11-06 DIAGNOSIS — J432 Centrilobular emphysema: Secondary | ICD-10-CM

## 2015-11-06 MED ORDER — FLUTICASONE FUROATE-VILANTEROL 100-25 MCG/INH IN AEPB: 1.0000 | INHALATION_SPRAY | Freq: Every day | RESPIRATORY_TRACT | Status: DC

## 2015-11-10 ENCOUNTER — Ambulatory Visit: Payer: Medicare Other | Admitting: Family Medicine

## 2015-11-10 DIAGNOSIS — R0609 Other forms of dyspnea: Secondary | ICD-10-CM | POA: Diagnosis not present

## 2015-11-10 DIAGNOSIS — R0789 Other chest pain: Secondary | ICD-10-CM | POA: Diagnosis not present

## 2015-11-10 DIAGNOSIS — F17210 Nicotine dependence, cigarettes, uncomplicated: Secondary | ICD-10-CM | POA: Diagnosis not present

## 2015-11-10 DIAGNOSIS — G4733 Obstructive sleep apnea (adult) (pediatric): Secondary | ICD-10-CM | POA: Diagnosis not present

## 2015-11-13 ENCOUNTER — Ambulatory Visit: Payer: Medicare Other | Admitting: Family Medicine

## 2015-11-19 DIAGNOSIS — R0789 Other chest pain: Secondary | ICD-10-CM | POA: Diagnosis not present

## 2015-11-19 DIAGNOSIS — R0609 Other forms of dyspnea: Secondary | ICD-10-CM | POA: Diagnosis not present

## 2015-11-26 ENCOUNTER — Other Ambulatory Visit: Payer: Self-pay | Admitting: Family Medicine

## 2015-11-26 DIAGNOSIS — E871 Hypo-osmolality and hyponatremia: Secondary | ICD-10-CM | POA: Diagnosis not present

## 2015-11-27 LAB — SODIUM: Sodium: 133 mmol/L — ABNORMAL LOW (ref 134–144)

## 2015-12-08 DIAGNOSIS — J449 Chronic obstructive pulmonary disease, unspecified: Secondary | ICD-10-CM | POA: Diagnosis not present

## 2015-12-08 DIAGNOSIS — Z9989 Dependence on other enabling machines and devices: Secondary | ICD-10-CM | POA: Diagnosis not present

## 2015-12-08 DIAGNOSIS — G4733 Obstructive sleep apnea (adult) (pediatric): Secondary | ICD-10-CM | POA: Diagnosis not present

## 2015-12-08 DIAGNOSIS — F17210 Nicotine dependence, cigarettes, uncomplicated: Secondary | ICD-10-CM | POA: Diagnosis not present

## 2015-12-16 ENCOUNTER — Ambulatory Visit (INDEPENDENT_AMBULATORY_CARE_PROVIDER_SITE_OTHER): Payer: Medicare Other | Admitting: Family Medicine

## 2015-12-16 ENCOUNTER — Encounter: Payer: Self-pay | Admitting: Family Medicine

## 2015-12-16 VITALS — BP 114/68 | HR 69 | Temp 98.1°F | Resp 16 | Ht 63.0 in | Wt 136.0 lb

## 2015-12-16 DIAGNOSIS — Z1231 Encounter for screening mammogram for malignant neoplasm of breast: Secondary | ICD-10-CM

## 2015-12-16 DIAGNOSIS — Z0001 Encounter for general adult medical examination with abnormal findings: Secondary | ICD-10-CM | POA: Diagnosis not present

## 2015-12-16 DIAGNOSIS — Z0000 Encounter for general adult medical examination without abnormal findings: Secondary | ICD-10-CM | POA: Diagnosis not present

## 2015-12-16 DIAGNOSIS — Z720 Tobacco use: Secondary | ICD-10-CM

## 2015-12-16 DIAGNOSIS — F17209 Nicotine dependence, unspecified, with unspecified nicotine-induced disorders: Secondary | ICD-10-CM

## 2015-12-16 DIAGNOSIS — H169 Unspecified keratitis: Secondary | ICD-10-CM

## 2015-12-16 DIAGNOSIS — J432 Centrilobular emphysema: Secondary | ICD-10-CM

## 2015-12-16 DIAGNOSIS — Z23 Encounter for immunization: Secondary | ICD-10-CM

## 2015-12-16 NOTE — Progress Notes (Signed)
Name: Kim Farmer   MRN: [id number]    DOB: 03-27-1951   Date:12/16/2015       Progress Note  Subjective  Chief Complaint  Chief Complaint  Patient presents with  . Welcome to Medicare    HPI  Functional ability/safety issues: No Issues Hearing issues: Addressed  Activities of daily living: Discussed Home safety issues: No Issues  End Of Life Planning: Offered verbal information regarding advanced directives, healthcare power of attorney.  Preventative care, Health maintenance, Preventative health measures discussed.  Preventative screenings discussed today: lab work, colonoscopy - she said that she had it done at home,  mammogram, DEXA.  Low Dose CT Chest recommended if Age 53-80 years, 30 pack-year currently smoking OR have quit w/in 15years. Already had CT and has a lung nodule  Lifestyle risk factor issued reviewed: Diet, exercise, weight management, advised patient smoking is not healthy, nutrition/diet.  Preventative health measures discussed (5-10 year plan).  Reviewed and recommended vaccinations: - Pneumovax  - Prevnar  - Annual Influenza - Zostavax - she thinks she had at previous PCP - Tdap - she thinks she had at previous PCP so we will get a copy of records  Depression screening: on medication at this time Fall risk screening: Done Discuss ADLs/IADLs: Done  Current medical providers: See HPI - pain doctor at the [facility], Dr. Raul Del - pulmonologist  Other health risk factors identified this visit: No other issues Cognitive impairment issues: she is falling asleep, on short term disability per Dr Raul Del and getting evaluated for sleep apnea. She also states it affects her memory. All above discussed with patient. Appropriate education, counseling and referral will be made based upon the above.     Keratitis: having a flare , using topical medication, but not improving. She will follow up with ophthalmologist at [facility]. Eyes are burning  and red - like sand paper when she blinks  Emphysema: cutting down on smoking, down from 2 packs daily to 5-7 cigarettes daily with Chantix. Still has a cough and decrease in exercise tolerance. Recently had an exercise stress test, but able to get to maximum capacity. She was advised by Dr. Raul Del to see cardiologist    Patient Active Problem List   Diagnosis Date Noted  . Coronary artery calcification seen on CT scan 11/04/2015  . History of esophageal stricture 10/28/2015  . Lung nodule, solitary 10/28/2015  . Allergic rhinitis, seasonal 10/10/2015  . Other specified diseases of esophagus   . Problems with swallowing and mastication   . Centrilobular emphysema ([place]) 06/16/2015  . Cervical radiculitis 06/16/2015  . DDD (degenerative disc disease), lumbar 06/16/2015  . Osteoarthritis 06/16/2015  . Chronic pain 06/16/2015  . Bunion 06/16/2015  . GAD (generalized anxiety disorder) 06/16/2015  . Lumbar radiculitis 06/16/2015  . Dyslipidemia 06/16/2015  . Hiatal hernia 06/16/2015  . GERD without esophagitis 06/16/2015  . 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 History of skin cancer 06/16/2015  . OSA on CPAP 06/16/2015  . Tobacco use disorder, continuous 12/17/2014  . Disturbance in sleep behavior 12/17/2014    Past Surgical History  Procedure Laterality Date  . Abdominal hysterectomy    . Eye surgery    . Foot surgery    . Esophageal dilation    . Esophagogastroduodenoscopy (egd) with propofol N/A 08/07/2015    Procedure: ESOPHAGOGASTRODUODENOSCOPY (EGD) WITH PROPOFOL with balloon dilation;  Surgeon: Lucilla Lame, MD;  Location: [facility] [place];  Service: Endoscopy;  Laterality: N/A;    Family History  Problem Relation Age of Onset  . Diabetes Mother   . Hypertension Mother   . Diabetes Father   . Hypertension Father   . COPD Sister   . Diabetes Sister   . Emphysema Sister     Social History   Social History  . Marital  Status: Single    Spouse Name: N/A  . Number of Children: N/A  . Years of Education: N/A   Occupational History  . Not on file.   Social History Main Topics  . Smoking status: Current Every Day Smoker -- 1.00 packs/day for 40 years    Types: Cigarettes    Start date: 06/16/1975  . Smokeless tobacco: Never Used  . Alcohol Use: 0.0 oz/week    0 Standard drinks or equivalent per week     Comment: 2 beers today - daughter states the patient drinks regularly and heavily  . Drug Use: No  . Sexual Activity: Not Currently   Other Topics Concern  . Not on file   Social History Narrative     Current outpatient prescriptions:  .  umeclidinium-vilanterol (ANORO ELLIPTA) 62.5-25 MCG/INH AEPB, Inhale into the lungs., Disp: , Rfl:  .  albuterol (PROAIR HFA) 108 (90 BASE) MCG/ACT inhaler, Inhale into the lungs., Disp: , Rfl:  .  ALPRAZolam (XANAX) 1 MG tablet, Take 1 mg by mouth 3 (three) times daily., Disp: , Rfl:  .  Armodafinil 200 MG TABS, TK 1 T PO QD, Disp: , Rfl: 5 .  aspirin EC 81 MG tablet, Take 1 tablet (81 mg total) by mouth daily., Disp: 30 tablet, Rfl: 0 .  atorvastatin (LIPITOR) 40 MG tablet, Take 1 tablet (40 mg total) by mouth daily., Disp: 30 tablet, Rfl: 3 .  Calcium-Vitamin D 600-200 MG-UNIT tablet, Take 1 tablet by mouth daily., Disp: , Rfl:  .  CHANTIX CONTINUING MONTH PAK 1 MG tablet, TK 1 T PO BID, Disp: , Rfl: 2 .  citalopram (CELEXA) 20 MG tablet, Take 1 tablet (20 mg total) by mouth daily., Disp: 30 tablet, Rfl: 0 .  fluticasone (FLONASE) 50 MCG/ACT nasal spray, Place 2 sprays into both nostrils daily., Disp: 16 g, Rfl: 5 .  Omega-3 Fatty Acids (FISH OIL) 1000 MG CAPS, Take 1 capsule by mouth daily. Reported on 08/07/2015, Disp: , Rfl:  .  omeprazole (PRILOSEC) 20 MG capsule, Take by mouth., Disp: , Rfl:  .  ondansetron (ZOFRAN) 4 MG tablet, Reported on 08/07/2015, Disp: , Rfl: 0 .  Oxycodone HCl 20 MG TABS, Take 1 tablet by mouth every 4 (four) hours as needed.,  Disp: , Rfl: 0 .  RESTASIS 0.05 % ophthalmic emulsion, , Disp: , Rfl: 0 .  triamcinolone ointment (KENALOG) 0.1 %, Reported on 11/04/2015, Disp: , Rfl:  .  valACYclovir (VALTREX) 1000 MG tablet, , Disp: , Rfl: 3 .  VOLTAREN 1 % GEL, Apply 1 application topically daily. Reported on 11/04/2015, Disp: , Rfl: 1  Allergies  Allergen Reactions  . Penicillins Shortness Of Breath    Throat swells  . Sulfa Antibiotics Other (See Comments)    Unknown reaction as child     ROS  Constitutional: Negative for fever but positive for weight change.  Respiratory:  Positive for cough and shortness of breath.   Cardiovascular: Negative for chest pain or palpitations.  Gastrointestinal: Negative for abdominal pain, no bowel changes.  Musculoskeletal: Negative for gait problem or joint swelling.  Skin: Negative for rash.  Neurological:  Negative for dizziness or headache.  No other specific complaints in a complete review of systems (except as listed in HPI above).  Objective  Filed Vitals:   12/16/15 1429  BP: 114/68  Pulse: 69  Temp: 98.1 F (36.7 C)  TempSrc: Oral  Resp: 16  Height: 5\' 3"  (1.6 m)  Weight: 136 lb (61.689 kg)  SpO2: 97%    Body mass index is 24.1 kg/(m^2).  Physical Exam  Constitutional: Patient appears well-developed and well-nourished. Anxious  HENT: Head: Normocephalic and atraumatic. Ears: B TMs ok, no erythema or effusion; Nose: Nose normal. Mouth/Throat: Oropharynx is clear and moist. No oropharyngeal exudate.  Eyes: Conjunctivae and EOM are normal. Pupils are equal, round, and reactive to light. No scleral icterus.  Neck: Normal range of motion. Neck supple. No JVD present. No thyromegaly present.  Cardiovascular: Normal rate, regular rhythm and normal heart sounds.  No murmur heard. No BLE edema. Pulmonary/Chest: Effort normal and breath sounds normal. No respiratory distress. Abdominal: Soft. Bowel sounds are normal, no distension. There is no tenderness. no  masses Breast: no lumps or masses, no nipple discharge or rashes, s/p breast augmentation  FEMALE GENITALIA:  External genitalia normal External urethra normal Vaginal vault normal without discharge or lesions Cervix: not present  Bimanual exam normal without masses RECTAL: not done Musculoskeletal: Normal range of motion, no joint effusions. No gross deformities Neurological: he is alert and oriented to person, place, and time. No cranial nerve deficit. Coordination, balance, strength, speech and gait are normal.  Skin: Skin is warm and dry. No rash noted. No erythema.  Psychiatric: Patient has a normal mood and affect. behavior is normal. Judgment and thought content normal.  Recent Results (from the past 2160 hour(s))  POCT urinalysis dipstick     Status: Abnormal   Collection Time: 10/10/15  3:18 PM  Result Value Ref Range   Color, UA yellow    Clarity, UA clear    Glucose, UA neg    Bilirubin, UA neg    Ketones, UA neg    Spec Grav, UA 1.015    Blood, UA non-hemolyzed trace    pH, UA 5.0    Protein, UA neg    Urobilinogen, UA negative    Nitrite, UA neg    Leukocytes, UA moderate (2+) (A) Negative  Urine culture     Status: None   Collection Time: 10/13/15 12:00 AM  Result Value Ref Range   Urine Culture, Routine Final report    Urine Culture result 1 No growth   Lipid panel     Status: Abnormal   Collection Time: 10/28/15 12:36 PM  Result Value Ref Range   Cholesterol, Total 194 100 - 199 mg/dL   Triglycerides 128 0 - 149 mg/dL   HDL 46 >39 mg/dL   VLDL Cholesterol Cal 26 5 - 40 mg/dL   LDL Calculated 122 (H) 0 - 99 mg/dL   Chol/HDL Ratio 4.2 0.0 - 4.4 ratio units    Comment:                                   T. Chol/HDL Ratio                                             Men  Women  1/2 Avg.Risk  3.4    3.3                                   Avg.Risk  5.0    4.4                                2X Avg.Risk  9.6    7.1                                 3X Avg.Risk 23.4   11.0   CBC with Differential/Platelet     Status: None   Collection Time: 10/28/15 12:36 PM  Result Value Ref Range   WBC 9.4 3.4 - 10.8 x10E3/uL   RBC 3.93 3.77 - 5.28 x10E6/uL   Hemoglobin 12.5 11.1 - 15.9 g/dL   Hematocrit 36.0 34.0 - 46.6 %   MCV 92 79 - 97 fL   MCH 31.8 26.6 - 33.0 pg   MCHC 34.7 31.5 - 35.7 g/dL   RDW 13.3 12.3 - 15.4 %   Platelets 296 150 - 379 x10E3/uL   Neutrophils 74 %   Lymphs 16 %   Monocytes 8 %   Eos 2 %   Basos 0 %   Neutrophils Absolute 6.9 1.4 - 7.0 x10E3/uL   Lymphocytes Absolute 1.5 0.7 - 3.1 x10E3/uL   Monocytes Absolute 0.8 0.1 - 0.9 x10E3/uL   EOS (ABSOLUTE) 0.2 0.0 - 0.4 x10E3/uL   Basophils Absolute 0.0 0.0 - 0.2 x10E3/uL   Immature Granulocytes 0 %   Immature Grans (Abs) 0.0 0.0 - 0.1 x10E3/uL  Comprehensive metabolic panel     Status: Abnormal   Collection Time: 10/28/15 12:36 PM  Result Value Ref Range   Glucose 107 (H) 65 - 99 mg/dL   BUN 8 8 - 27 mg/dL   Creatinine, Ser 0.62 0.57 - 1.00 mg/dL   GFR calc non Af Amer 95 >59 mL/min/1.73   GFR calc Af Amer 109 >59 mL/min/1.73   BUN/Creatinine Ratio 13 11 - 26   Sodium 130 (L) 134 - 144 mmol/L   Potassium 4.6 3.5 - 5.2 mmol/L   Chloride 89 (L) 96 - 106 mmol/L   CO2 28 18 - 29 mmol/L   Calcium 8.7 8.7 - 10.3 mg/dL   Total Protein 6.0 6.0 - 8.5 g/dL   Albumin 3.8 3.6 - 4.8 g/dL   Globulin, Total 2.2 1.5 - 4.5 g/dL   Albumin/Globulin Ratio 1.7 1.2 - 2.2    Comment:               **Please note reference interval change**   Bilirubin Total 0.3 0.0 - 1.2 mg/dL   Alkaline Phosphatase 119 (H) 39 - 117 IU/L   AST 22 0 - 40 IU/L   ALT 25 0 - 32 IU/L  Hepatitis C antibody     Status: None   Collection Time: 10/28/15 12:36 PM  Result Value Ref Range   Hep C Virus Ab <0.1 0.0 - 0.9 s/co ratio    Comment:                                   Negative:     < 0.8  Indeterminate: 0.8 - 0.9                                   Positive:      > 0.9  The CDC recommends that a positive HCV antibody result  be followed up with a HCV Nucleic Acid Amplification  test [id number]).   Brain natriuretic peptide     Status: None   Collection Time: 10/28/15 12:36 PM  Result Value Ref Range   BNP 91.0 0.0 - 100.0 pg/mL  Sodium     Status: Abnormal   Collection Time: 11/26/15  3:26 PM  Result Value Ref Range   Sodium 133 (L) 134 - 144 mmol/L      PHQ2/9: Depression screen [facility] 2/9 11/04/2015 10/28/2015 10/10/2015 06/16/2015  Decreased Interest 0 0 0 0  Down, Depressed, Hopeless 0 0 0 0  PHQ - 2 Score 0 0 0 0    Fall Risk: Fall Risk  11/04/2015 10/28/2015 10/10/2015 06/16/2015  Falls in the past year? No No No No     Assessment & Plan  1. Welcome to Medicare preventive visit  - EKG 12-Lead - Visual acuity screening - DG Bone Density; Future  2. Keratitis  Follow up with Dr. Marvel Plan  3. Tobacco use disorder, continuous  Continue Chantix  4. Encounter for screening mammogram for breast cancer  - MM Digital Screening; Future  5. Centrilobular emphysema ([place])  Keep follow up with Dr. Raul Del, switched from [facility] to [facility] by Dr. Raul Del

## 2015-12-25 DIAGNOSIS — H16223 Keratoconjunctivitis sicca, not specified as Sjogren's, bilateral: Secondary | ICD-10-CM | POA: Diagnosis not present

## 2016-01-06 DIAGNOSIS — H16223 Keratoconjunctivitis sicca, not specified as Sjogren's, bilateral: Secondary | ICD-10-CM | POA: Diagnosis not present

## 2016-01-08 ENCOUNTER — Encounter: Payer: Self-pay | Admitting: Family Medicine

## 2016-01-13 DIAGNOSIS — H16223 Keratoconjunctivitis sicca, not specified as Sjogren's, bilateral: Secondary | ICD-10-CM | POA: Diagnosis not present

## 2016-01-16 DIAGNOSIS — M4722 Other spondylosis with radiculopathy, cervical region: Secondary | ICD-10-CM | POA: Diagnosis not present

## 2016-01-16 DIAGNOSIS — M791 Myalgia: Secondary | ICD-10-CM | POA: Diagnosis not present

## 2016-01-16 DIAGNOSIS — G8929 Other chronic pain: Secondary | ICD-10-CM | POA: Diagnosis not present

## 2016-01-16 DIAGNOSIS — Z79891 Long term (current) use of opiate analgesic: Secondary | ICD-10-CM | POA: Diagnosis not present

## 2016-02-12 ENCOUNTER — Other Ambulatory Visit: Payer: Self-pay | Admitting: Family Medicine

## 2016-02-12 NOTE — Telephone Encounter (Signed)
Patient requesting refill. 

## 2016-02-12 NOTE — Telephone Encounter (Signed)
Appointment made for 03-19-16 due to money situation

## 2016-02-19 ENCOUNTER — Other Ambulatory Visit: Payer: Self-pay | Admitting: Family Medicine

## 2016-02-19 NOTE — Telephone Encounter (Signed)
Patient requesting refill. 

## 2016-02-27 ENCOUNTER — Ambulatory Visit (INDEPENDENT_AMBULATORY_CARE_PROVIDER_SITE_OTHER): Payer: Medicare Other | Admitting: Family Medicine

## 2016-02-27 ENCOUNTER — Encounter: Payer: Self-pay | Admitting: Family Medicine

## 2016-02-27 VITALS — BP 132/66 | HR 62 | Temp 97.9°F | Resp 18 | Ht 63.0 in | Wt 141.4 lb

## 2016-02-27 DIAGNOSIS — J441 Chronic obstructive pulmonary disease with (acute) exacerbation: Secondary | ICD-10-CM

## 2016-02-27 DIAGNOSIS — F331 Major depressive disorder, recurrent, moderate: Secondary | ICD-10-CM | POA: Diagnosis not present

## 2016-02-27 DIAGNOSIS — Z720 Tobacco use: Secondary | ICD-10-CM | POA: Diagnosis not present

## 2016-02-27 DIAGNOSIS — F411 Generalized anxiety disorder: Secondary | ICD-10-CM

## 2016-02-27 DIAGNOSIS — E785 Hyperlipidemia, unspecified: Secondary | ICD-10-CM | POA: Diagnosis not present

## 2016-02-27 DIAGNOSIS — J432 Centrilobular emphysema: Secondary | ICD-10-CM | POA: Diagnosis not present

## 2016-02-27 DIAGNOSIS — I2510 Atherosclerotic heart disease of native coronary artery without angina pectoris: Secondary | ICD-10-CM

## 2016-02-27 DIAGNOSIS — F17209 Nicotine dependence, unspecified, with unspecified nicotine-induced disorders: Secondary | ICD-10-CM

## 2016-02-27 DIAGNOSIS — L299 Pruritus, unspecified: Secondary | ICD-10-CM

## 2016-02-27 DIAGNOSIS — G4733 Obstructive sleep apnea (adult) (pediatric): Secondary | ICD-10-CM | POA: Diagnosis not present

## 2016-02-27 DIAGNOSIS — R21 Rash and other nonspecific skin eruption: Secondary | ICD-10-CM | POA: Diagnosis not present

## 2016-02-27 DIAGNOSIS — Z9989 Dependence on other enabling machines and devices: Secondary | ICD-10-CM

## 2016-02-27 DIAGNOSIS — G479 Sleep disorder, unspecified: Secondary | ICD-10-CM | POA: Diagnosis not present

## 2016-02-27 MED ORDER — CITALOPRAM HYDROBROMIDE 20 MG PO TABS: 20.0000 mg | ORAL_TABLET | Freq: Every day | ORAL | Status: DC

## 2016-02-27 MED ORDER — HYDROXYZINE HCL 10 MG PO TABS: 10.0000 mg | ORAL_TABLET | Freq: Every evening | ORAL | Status: DC | PRN

## 2016-02-27 MED ORDER — PREDNISONE 10 MG PO TABS: 10.0000 mg | ORAL_TABLET | Freq: Every day | ORAL | Status: DC

## 2016-02-27 MED ORDER — ATORVASTATIN CALCIUM 40 MG PO TABS: ORAL_TABLET | ORAL | Status: AC

## 2016-02-27 MED ORDER — LEVOFLOXACIN 500 MG PO TABS: 500.0000 mg | ORAL_TABLET | Freq: Every day | ORAL | Status: DC

## 2016-02-27 MED ORDER — UMECLIDINIUM-VILANTEROL 62.5-25 MCG/INH IN AEPB: 1.0000 | INHALATION_SPRAY | Freq: Every day | RESPIRATORY_TRACT | Status: DC

## 2016-02-27 NOTE — Progress Notes (Addendum)
Name: Kim Farmer   MRN: [id number]    DOB: [birth year]   Date:02/27/2016       Progress Note  Subjective  Chief Complaint  Chief Complaint  Patient presents with  . COPD    she has seen pulmonologist  . Mass    Onset-1 month, Red, Itchy and when scratching they scab over. Patient has been to [facility] and they gave her a cream which did not help and stated she had a contact rash. Patient states everything she scratchs them white pus comes out of the bumps.   . Anxiety    Patient has been anxiety felt and states she does want to go Cardiology but does not have the money to go.    HPI  Emphysema/COPD exacerbation: cutting down on smoking, down from 2 packs daily to 5-7 cigarettes daily with Chantix. Still has a cough and decrease in exercise tolerance. Recently had an exercise stress test, but able to get to maximum capacity. She was advised by Dr. Raul Del to see cardiologist but she has not been able to afford it at this time. She states over the past month her cough is more wet and has a productive sputum and once it has some streaks of blood, otherwise color is yellow, she has noticed increase in wheezing - worse at night and also worsening of SOB. She was switched from [facility] to [facility] a couple of months ago. Last time she took antibiotics was bout 6 months ago.   OSA: falls asleep all the time, and Dr. Raul Del told her to not work, she is on Provigil but still can fall asleep for hours during the day, discussed referral to Neurologist.   Abnormal CT : CT chest  showed coronary calcification of two vessels, also parenchymal disease and centrilobular emphysema. She is still smoking, and explained importance of stopping smoking. She has not seen the cardiologist as recommended yet because she can't afford it at this time  Rash: seen Dermatologist and given topical medication, she states rash starts as a bump and she scratches and leaves a scar  GAD/Major Depression: patient states  she has a long history of anxiety, took Duloxetine and Elavil in the past. Currently only on Alprazolam, but still feels anxious, she worries all the time, has episodes of panic attack _ she states mother had similar symptoms. She is a Research officer, trade [employer], supposed to be taking Citalopram but not sure if she is taking daily since she has not filled medication in months. She has also been crying, feeling lonely and hopeless.   GERD: she has been taking Omeprazole, and reflux symptoms have been controlled, but she has a history of esophageal stricture and recently chocking with hard food, but also pills.   Chronic pain: sees pain clinic in [facility] in [place] . She is going there since injured in 2009 - after an injury at [place]. She has cervical DDD and lumbar radiculitis, chronic pain is stable, pain level at this time 4-5/10, constant but not unbearable. Denies constipation or mental fogginess   Patient Active Problem List   Diagnosis Date Noted  . Coronary artery calcification seen on CT scan 11/04/2015  . History of esophageal stricture 10/28/2015  . Lung nodule, solitary 10/28/2015  . Allergic rhinitis, seasonal 10/10/2015  . Other specified diseases of esophagus   . Problems with swallowing and mastication   . Centrilobular emphysema ([place]) 06/16/2015  . Cervical radiculitis 06/16/2015  . DDD (degenerative disc disease), lumbar 06/16/2015  .  Osteoarthritis 06/16/2015  . Chronic pain 06/16/2015  . Bunion 06/16/2015  . GAD (generalized anxiety disorder) 06/16/2015  . Lumbar radiculitis 06/16/2015  . Dyslipidemia 06/16/2015  . Hiatal hernia 06/16/2015  . GERD without esophagitis 06/16/2015  . 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 History of skin cancer 06/16/2015  . OSA on CPAP 06/16/2015  . Tobacco use disorder, continuous 12/17/2014  . Disturbance in sleep behavior 12/17/2014    Past Surgical History  Procedure Laterality Date  .  Abdominal hysterectomy    . Eye surgery    . Foot surgery    . Esophageal dilation    . Esophagogastroduodenoscopy (egd) with propofol N/A 08/07/2015    Procedure: ESOPHAGOGASTRODUODENOSCOPY (EGD) WITH PROPOFOL with balloon dilation;  Surgeon: Lucilla Lame, MD;  Location: [facility];  Service: Endoscopy;  Laterality: N/A;  . Breast surgery      saline implants    Family History  Problem Relation Age of Onset  . Diabetes Mother   . Hypertension Mother   . Diabetes Father   . Hypertension Father   . COPD Sister   . Diabetes Sister   . Emphysema Sister     Social History   Social History  . Marital Status: Single    Spouse Name: N/A  . Number of Children: N/A  . Years of Education: N/A   Occupational History  . Not on file.   Social History Main Topics  . Smoking status: Current Every Day Smoker -- 1.00 packs/day for 40 years    Types: Cigarettes    Start date: 06/16/1975  . Smokeless tobacco: Never Used  . Alcohol Use: 0.0 oz/week    0 Standard drinks or equivalent per week     Comment: 2 beers today - daughter states the patient drinks regularly and heavily  . Drug Use: No  . Sexual Activity: Not Currently   Other Topics Concern  . Not on file   Social History Narrative     Current outpatient prescriptions:  .  albuterol (PROAIR HFA) 108 (90 BASE) MCG/ACT inhaler, Inhale into the lungs., Disp: , Rfl:  .  ALPRAZolam (XANAX) 1 MG tablet, Take 1 mg by mouth 3 (three) times daily., Disp: , Rfl:  .  Armodafinil 250 MG tablet, TK 1 T PO QD, Disp: , Rfl: 5 .  aspirin EC 81 MG tablet, Take 1 tablet (81 mg total) by mouth daily., Disp: 30 tablet, Rfl: 0 .  atorvastatin (LIPITOR) 40 MG tablet, TAKE 1 TABLET(40 MG) BY MOUTH DAILY, Disp: 30 tablet, Rfl: 5 .  Calcium-Vitamin D 600-200 MG-UNIT tablet, Take 1 tablet by mouth daily., Disp: , Rfl:  .  CHANTIX CONTINUING MONTH PAK 1 MG tablet, TAKE 1 TABLET BY MOUTH TWICE DAILY, Disp: 56 tablet, Rfl: 0 .  citalopram  (CELEXA) 20 MG tablet, Take 1 tablet (20 mg total) by mouth daily., Disp: 30 tablet, Rfl: 5 .  DUREZOL 0.05 % EMUL, INT 1 GTT INTO OU QID, Disp: , Rfl: 1 .  fluticasone (FLONASE) 50 MCG/ACT nasal spray, Place 2 sprays into both nostrils daily., Disp: 16 g, Rfl: 5 .  Omega-3 Fatty Acids (FISH OIL) 1000 MG CAPS, Take 1 capsule by mouth daily. Reported on 08/07/2015, Disp: , Rfl:  .  omeprazole (PRILOSEC) 20 MG capsule, Take by mouth., Disp: , Rfl:  .  ondansetron (ZOFRAN) 4 MG tablet, Reported on 08/07/2015, Disp: , Rfl: 0 .  Oxycodone HCl 20 MG TABS, Take 1  tablet by mouth every 4 (four) hours as needed., Disp: , Rfl: 0 .  RESTASIS 0.05 % ophthalmic emulsion, , Disp: , Rfl: 0 .  triamcinolone ointment (KENALOG) 0.1 %, Reported on 11/04/2015, Disp: , Rfl:  .  valACYclovir (VALTREX) 1000 MG tablet, , Disp: , Rfl: 3 .  VOLTAREN 1 % GEL, Apply 1 application topically daily. Reported on 11/04/2015, Disp: , Rfl: 1 .  hydrOXYzine (ATARAX/VISTARIL) 10 MG tablet, Take 1 tablet (10 mg total) by mouth at bedtime as needed., Disp: 30 tablet, Rfl: 0 .  levofloxacin (LEVAQUIN) 500 MG tablet, Take 1 tablet (500 mg total) by mouth daily., Disp: 7 tablet, Rfl: 0 .  predniSONE (DELTASONE) 10 MG tablet, Take 1 tablet (10 mg total) by mouth daily with breakfast., Disp: 10 tablet, Rfl: 0 .  umeclidinium-vilanterol (ANORO ELLIPTA) 62.5-25 MCG/INH AEPB, Inhale 1 puff into the lungs daily., Disp: 60 each, Rfl: 0  Allergies  Allergen Reactions  . Penicillins Shortness Of Breath    Throat swells  . Sulfa Antibiotics Other (See Comments)    Unknown reaction as child     ROS  Ten systems reviewed and is negative except as mentioned in HPI   Objective  Filed Vitals:   02/27/16 0908  BP: 132/66  Pulse: 62  Temp: 97.9 F (36.6 C)  TempSrc: Oral  Resp: 18  Height: 5\' 3"  (1.6 m)  Weight: 141 lb 6.4 oz (64.139 kg)  SpO2: 96%    Body mass index is 25.05 kg/(m^2).  Physical Exam  Constitutional: Patient  appears well-developed and well-nourished.  No distress.  HEENT: head atraumatic, normocephalic, pupils equal and reactive to light, neck supple, throat within normal limits Cardiovascular: Normal rate, regular rhythm and normal heart sounds.  No murmur heard. No BLE edema. Pulmonary/Chest: Effort normal , she has diffuse rhonchi and end expiratory wheezing.  No respiratory distress. Abdominal: Soft.  There is no tenderness. Psychiatric: Patient has a normal mood and affect. behavior is normal. Judgment and thought content normal. Skin: scarring from picking on her skin   PHQ2/9: Depression screen [facility] 2/9 02/27/2016 11/04/2015 10/28/2015 10/10/2015 06/16/2015  Decreased Interest 1 0 0 0 0  Down, Depressed, Hopeless 3 0 0 0 0  PHQ - 2 Score 4 0 0 0 0  Altered sleeping 1 - - - -  Tired, decreased energy 3 - - - -  Change in appetite 3 - - - -  Feeling bad or failure about yourself  3 - - - -  Trouble concentrating 3 - - - -  Moving slowly or fidgety/restless 3 - - - -  Suicidal thoughts 0 - - - -  PHQ-9 Score 20 - - - -  Difficult doing work/chores Extremely dIfficult - - - -     Fall Risk: Fall Risk  02/27/2016 11/04/2015 10/28/2015 10/10/2015 06/16/2015  Falls in the past year? No No No No No      Functional Status Survey: Is the patient deaf or have difficulty hearing?: No Does the patient have difficulty seeing, even when wearing glasses/contacts?: No Does the patient have difficulty concentrating, remembering, or making decisions?: No Does the patient have difficulty walking or climbing stairs?: No Does the patient have difficulty dressing or bathing?: No Does the patient have difficulty doing errands alone such as visiting a doctor's office or shopping?: No    Assessment & Plan  1. Centrilobular emphysema ([place])  We will start antibiotics and prednisone, needs to follow up with Dr. Raul Del - umeclidinium-vilanterol (  ANORO ELLIPTA) 62.5-25 MCG/INH AEPB; Inhale 1 puff into  the lungs daily.  Dispense: 60 each; Refill: 0  2. GAD (generalized anxiety disorder)  Needs to consider therapy, but can't afford, discussed hospice counseling - lost her sister 08/21/2015 - citalopram (CELEXA) 20 MG tablet; Take 1 tablet (20 mg total) by mouth daily.  Dispense: 30 tablet; Refill: 5  3. OSA on CPAP  May also have narcolepsy, states went to the bathroom one night and woke up 3 hours later, sitting on toilette seat. She needs to see Neurologist, but she can't afford it at this time  4. Tobacco use disorder, continuous  Continue Chantix, needs to quit smoking  5. Rash  It may be secondary to anxiety, seen by Dermatologist, if no improvement she needs to have a biopsy, but can't afford it at this time - hydrOXYzine (ATARAX/VISTARIL) 10 MG tablet; Take 1 tablet (10 mg total) by mouth at bedtime as needed.  Dispense: 30 tablet; Refill: 0  6. Pruritus  Add Atarax at night  7. COPD exacerbation (HCC)  - levofloxacin (LEVAQUIN) 500 MG tablet; Take 1 tablet (500 mg total) by mouth daily.  Dispense: 7 tablet; Refill: 0 - predniSONE (DELTASONE) 10 MG tablet; Take 1 tablet (10 mg total) by mouth daily with breakfast.  Dispense: 10 tablet; Refill: 0  8. Dyslipidemia  - atorvastatin (LIPITOR) 40 MG tablet; TAKE 1 TABLET(40 MG) BY MOUTH DAILY  Dispense: 30 tablet; Refill: 5   9. Moderate episode of recurrent major depressive disorder (HCC)  Continue Celexa, needs counseling, she will try Hospice, her sister died in 08-23-2014 and she is always worried about dying like her, at home   10. Disturbance of sleep  - Ambulatory referral to Neurology

## 2016-02-27 NOTE — Addendum Note (Signed)
Addended by: Steele Sizer F on: 02/27/2016 09:58 AM   Modules accepted: Orders, SmartSet

## 2016-03-01 ENCOUNTER — Telehealth: Payer: Self-pay

## 2016-03-01 NOTE — Telephone Encounter (Signed)
Per the request of Dr. Steele Sizer, patient was instructed to hold off of the Atarax and check her blood pressure then if her sx are persistent to give [place] a call back in the morning for an appointment.

## 2016-03-01 NOTE — Telephone Encounter (Signed)
Patient called stating this morning she woke up [place], wanted to know if you thought if it was one of the new medications and what she should do?

## 2016-03-09 ENCOUNTER — Other Ambulatory Visit: Payer: Self-pay | Admitting: *Deleted

## 2016-03-09 ENCOUNTER — Inpatient Hospital Stay
Admission: RE | Admit: 2016-03-09 | Discharge: 2016-03-09 | Disposition: A | Discharge status: Home / Self Care | Payer: Self-pay | Source: Ambulatory Visit | Attending: *Deleted | Admitting: *Deleted

## 2016-03-09 DIAGNOSIS — Z9289 Personal history of other medical treatment: Secondary | ICD-10-CM

## 2016-03-09 DIAGNOSIS — F17210 Nicotine dependence, cigarettes, uncomplicated: Secondary | ICD-10-CM | POA: Diagnosis not present

## 2016-03-09 DIAGNOSIS — G4733 Obstructive sleep apnea (adult) (pediatric): Secondary | ICD-10-CM | POA: Diagnosis not present

## 2016-03-09 DIAGNOSIS — Z9989 Dependence on other enabling machines and devices: Secondary | ICD-10-CM | POA: Diagnosis not present

## 2016-03-09 DIAGNOSIS — R5383 Other fatigue: Secondary | ICD-10-CM | POA: Diagnosis not present

## 2016-03-09 DIAGNOSIS — G4710 Hypersomnia, unspecified: Secondary | ICD-10-CM | POA: Diagnosis not present

## 2016-03-19 ENCOUNTER — Ambulatory Visit: Payer: Medicare Other | Admitting: Family Medicine

## 2016-03-23 ENCOUNTER — Other Ambulatory Visit: Payer: Self-pay | Admitting: Family Medicine

## 2016-03-23 ENCOUNTER — Ambulatory Visit
Admission: RE | Admit: 2016-03-23 | Discharge: 2016-03-23 | Disposition: A | Discharge status: Home / Self Care | Payer: Medicare Other | Source: Ambulatory Visit | Attending: Family Medicine | Admitting: Family Medicine

## 2016-03-23 DIAGNOSIS — Z1231 Encounter for screening mammogram for malignant neoplasm of breast: Secondary | ICD-10-CM | POA: Diagnosis not present

## 2016-03-24 ENCOUNTER — Ambulatory Visit (INDEPENDENT_AMBULATORY_CARE_PROVIDER_SITE_OTHER): Payer: Medicare Other | Admitting: Family Medicine

## 2016-03-24 ENCOUNTER — Encounter: Payer: Self-pay | Admitting: Family Medicine

## 2016-03-24 VITALS — BP 130/66 | HR 62 | Temp 98.2°F | Resp 16 | Ht 63.0 in | Wt 140.2 lb

## 2016-03-24 DIAGNOSIS — L299 Pruritus, unspecified: Secondary | ICD-10-CM | POA: Diagnosis not present

## 2016-03-24 DIAGNOSIS — J432 Centrilobular emphysema: Secondary | ICD-10-CM | POA: Diagnosis not present

## 2016-03-24 DIAGNOSIS — I2510 Atherosclerotic heart disease of native coronary artery without angina pectoris: Secondary | ICD-10-CM

## 2016-03-24 DIAGNOSIS — G479 Sleep disorder, unspecified: Secondary | ICD-10-CM | POA: Diagnosis not present

## 2016-03-24 MED ORDER — PERMETHRIN 5 % EX CREA: 1.0000 "application " | TOPICAL_CREAM | Freq: Once | CUTANEOUS | 0 refills | Status: AC

## 2016-03-24 NOTE — Progress Notes (Signed)
Name: Kim Farmer   MRN: [id number]    DOB: 06-Apr-1951   Date:03/24/2016       Progress Note  Subjective  Chief Complaint  Chief Complaint  Patient presents with  . Medication Refill    HPI  Emphysema/COPD exacerbation: she is up on smoking again, up to at least half a pack. Chantix did not help her quit this time. . Still has a cough and decrease in exercise tolerance. Recently had an exercise stress test, but able to get to maximum capacity. She was advised by Dr. Raul Del to see cardiologist but she has not been able to afford it at this time, she states she can see [facility] providers for free now, and will [facility] to see one at [facility]. Marland Kitchen She is doing better on Breo.  Last time she took antibiotics was 02/2016  Rash: seen Dermatologist and given topical medication, she states rash starts as a bump and she scratches and leaves a scar. She is very worried about scabies, symptoms started about 1 year ago  Sleep problems: seen by Dr. Melrose Nakayama and Dr. Raul Del, will have sleep study.    Patient Active Problem List   Diagnosis Date Noted  . Coronary artery calcification seen on CT scan 11/04/2015  . History of esophageal stricture 10/28/2015  . Lung nodule, solitary 10/28/2015  . Allergic rhinitis, seasonal 10/10/2015  . Other specified diseases of esophagus   . Problems with swallowing and mastication   . Centrilobular emphysema ([place]) 06/16/2015  . Cervical radiculitis 06/16/2015  . DDD (degenerative disc disease), lumbar 06/16/2015  . Osteoarthritis 06/16/2015  . Chronic pain 06/16/2015  . Bunion 06/16/2015  . GAD (generalized anxiety disorder) 06/16/2015  . Lumbar radiculitis 06/16/2015  . Dyslipidemia 06/16/2015  . Hiatal hernia 06/16/2015  . GERD without esophagitis 06/16/2015  . 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 History of skin cancer 06/16/2015  . OSA on CPAP 06/16/2015  . Tobacco use disorder, continuous 12/17/2014  .  Disturbance in sleep behavior 12/17/2014    Past Surgical History:  Procedure Laterality Date  . ABDOMINAL HYSTERECTOMY    . BREAST SURGERY     saline implants  . ESOPHAGEAL DILATION    . ESOPHAGOGASTRODUODENOSCOPY (EGD) WITH PROPOFOL N/A 08/07/2015   Procedure: ESOPHAGOGASTRODUODENOSCOPY (EGD) WITH PROPOFOL with balloon dilation;  Surgeon: Lucilla Lame, MD;  Location: [facility];  Service: Endoscopy;  Laterality: N/A;  . EYE SURGERY    . FOOT SURGERY      Family History  Problem Relation Age of Onset  . Diabetes Mother   . Hypertension Mother   . Diabetes Father   . Hypertension Father   . COPD Sister   . Diabetes Sister   . Emphysema Sister     Social History   Social History  . Marital status: Single    Spouse name: N/A  . Number of children: N/A  . Years of education: N/A   Occupational History  . Not on file.   Social History Main Topics  . Smoking status: Current Every Day Smoker    Packs/day: 1.00    Years: 40.00    Types: Cigarettes    Start date: 06/16/1975  . Smokeless tobacco: Never Used  . Alcohol use 0.0 oz/week     Comment: 2 beers today - daughter states the patient drinks regularly and heavily  . Drug use: No  . Sexual activity: Not Currently   Other Topics Concern  . Not on  file   Social History Narrative  . No narrative on file     Current Outpatient Prescriptions:  .  albuterol (PROAIR HFA) 108 (90 BASE) MCG/ACT inhaler, Inhale into the lungs., Disp: , Rfl:  .  ALPRAZolam (XANAX) 1 MG tablet, Take 1 mg by mouth 3 (three) times daily., Disp: , Rfl:  .  Armodafinil 250 MG tablet, TK 1 T PO QD, Disp: , Rfl: 5 .  aspirin EC 81 MG tablet, Take 1 tablet (81 mg total) by mouth daily., Disp: 30 tablet, Rfl: 0 .  atorvastatin (LIPITOR) 40 MG tablet, TAKE 1 TABLET(40 MG) BY MOUTH DAILY, Disp: 30 tablet, Rfl: 5 .  Calcium-Vitamin D 600-200 MG-UNIT tablet, Take 1 tablet by mouth daily., Disp: , Rfl:  .  citalopram (CELEXA) 20 MG tablet,  Take 1 tablet (20 mg total) by mouth daily., Disp: 30 tablet, Rfl: 5 .  DUREZOL 0.05 % EMUL, INT 1 GTT INTO OU QID, Disp: , Rfl: 1 .  fluticasone (FLONASE) 50 MCG/ACT nasal spray, Place 2 sprays into both nostrils daily., Disp: 16 g, Rfl: 5 .  Omega-3 Fatty Acids (FISH OIL) 1000 MG CAPS, Take 1 capsule by mouth daily. Reported on 08/07/2015, Disp: , Rfl:  .  omeprazole (PRILOSEC) 20 MG capsule, Take by mouth., Disp: , Rfl:  .  ondansetron (ZOFRAN) 4 MG tablet, Reported on 08/07/2015, Disp: , Rfl: 0 .  Oxycodone HCl 20 MG TABS, Take 1 tablet by mouth every 4 (four) hours as needed., Disp: , Rfl: 0 .  permethrin (ELIMITE) 5 % cream, Apply 1 application topically once., Disp: 60 g, Rfl: 0 .  RESTASIS 0.05 % ophthalmic emulsion, , Disp: , Rfl: 0 .  triamcinolone ointment (KENALOG) 0.1 %, Reported on 11/04/2015, Disp: , Rfl:  .  umeclidinium-vilanterol (ANORO ELLIPTA) 62.5-25 MCG/INH AEPB, Inhale 1 puff into the lungs daily., Disp: 60 each, Rfl: 0 .  valACYclovir (VALTREX) 1000 MG tablet, , Disp: , Rfl: 3 .  VOLTAREN 1 % GEL, Apply 1 application topically daily. Reported on 11/04/2015, Disp: , Rfl: 1  Allergies  Allergen Reactions  . Penicillins Shortness Of Breath    Throat swells  . Sulfa Antibiotics Other (See Comments)    Unknown reaction as child     ROS  Constitutional: Negative for fever or weight change.  Respiratory: Positive for cough and shortness of breath.   Cardiovascular: Negative for chest pain or palpitations.  Gastrointestinal: Negative for abdominal pain, no bowel changes.  Musculoskeletal: Negative for gait problem or joint swelling.  Skin: Positive  for rash.  Neurological: Negative for dizziness or headache.  No other specific complaints in a complete review of systems (except as listed in HPI above).  Objective  Vitals:   03/24/16 1130  BP: 130/66  Pulse: 62  Resp: 16  Temp: 98.2 F (36.8 C)  TempSrc: Oral  SpO2: 99%  Weight: 140 lb 3.2 oz (63.6 kg)   Height: 5\' 3"  (1.6 m)     Body mass index is 24.84 kg/m.  Physical Exam  Constitutional: Patient appears well-developed and well-nourished.  No distress.  HEENT: head atraumatic, normocephalic, pupils equal and reactive to light, neck supple, throat within normal limits Cardiovascular: Normal rate, regular rhythm and normal heart sounds.  No murmur heard. No BLE edema. Pulmonary/Chest: Effort normal , she has diffuse rhonchi and end expiratory wheezing  No respiratory distress. Abdominal: Soft.  There is no tenderness. Psychiatric: Patient is very anxious, upset about the bill she received. Judgment and thought  content normal. Skin: scarring from picking on her skin, worse on arms legs. She is very worried about scabies - we will treat her  PHQ2/9: Depression screen [facility] 2/9 02/27/2016 11/04/2015 10/28/2015 10/10/2015 06/16/2015  Decreased Interest 1 0 0 0 0  Down, Depressed, Hopeless 3 0 0 0 0  PHQ - 2 Score 4 0 0 0 0  Altered sleeping 1 - - - -  Tired, decreased energy 3 - - - -  Change in appetite 3 - - - -  Feeling bad or failure about yourself  3 - - - -  Trouble concentrating 3 - - - -  Moving slowly or fidgety/restless 3 - - - -  Suicidal thoughts 0 - - - -  PHQ-9 Score 20 - - - -  Difficult doing work/chores Extremely dIfficult - - - -     Fall Risk: Fall Risk  02/27/2016 11/04/2015 10/28/2015 10/10/2015 06/16/2015  Falls in the past year? No No No No No    Assessment & Plan  1. Centrilobular emphysema ([place])  Treated last time with prednisone and Levaquin, back to baseline. Still has daily cough and is still smoking  2. Disturbance of sleep  Seeing Dr. Raul Del and also and Dr. Melrose Nakayama, sleep study has been scheduled for September but she is worried about cost of medication  3. Pruritus  Explained that is likely from stress, could not tolerate Atarax, she wants to be treated for scabies - permethrin (ELIMITE) 5 % cream; Apply 1 application topically once.  Dispense:  60 g; Refill: 0

## 2016-04-13 DIAGNOSIS — M25552 Pain in left hip: Secondary | ICD-10-CM | POA: Diagnosis not present

## 2016-04-13 DIAGNOSIS — G8929 Other chronic pain: Secondary | ICD-10-CM | POA: Diagnosis not present

## 2016-04-13 DIAGNOSIS — Z79891 Long term (current) use of opiate analgesic: Secondary | ICD-10-CM | POA: Diagnosis not present

## 2016-04-13 DIAGNOSIS — M25551 Pain in right hip: Secondary | ICD-10-CM | POA: Diagnosis not present

## 2016-04-21 ENCOUNTER — Ambulatory Visit: Payer: Medicare Other | Attending: Specialist

## 2016-04-21 DIAGNOSIS — G4733 Obstructive sleep apnea (adult) (pediatric): Secondary | ICD-10-CM | POA: Insufficient documentation

## 2016-04-28 DIAGNOSIS — F17210 Nicotine dependence, cigarettes, uncomplicated: Secondary | ICD-10-CM | POA: Diagnosis not present

## 2016-04-28 DIAGNOSIS — G4733 Obstructive sleep apnea (adult) (pediatric): Secondary | ICD-10-CM | POA: Diagnosis not present

## 2016-04-28 DIAGNOSIS — Z9989 Dependence on other enabling machines and devices: Secondary | ICD-10-CM | POA: Diagnosis not present

## 2016-04-28 DIAGNOSIS — R0609 Other forms of dyspnea: Secondary | ICD-10-CM | POA: Diagnosis not present

## 2016-04-28 DIAGNOSIS — J439 Emphysema, unspecified: Secondary | ICD-10-CM | POA: Diagnosis not present

## 2016-05-05 ENCOUNTER — Ambulatory Visit: Payer: Medicare Other | Attending: Specialist

## 2016-05-05 DIAGNOSIS — G4733 Obstructive sleep apnea (adult) (pediatric): Secondary | ICD-10-CM | POA: Insufficient documentation

## 2016-05-06 DIAGNOSIS — D485 Neoplasm of uncertain behavior of skin: Secondary | ICD-10-CM | POA: Diagnosis not present

## 2016-05-06 DIAGNOSIS — L281 Prurigo nodularis: Secondary | ICD-10-CM | POA: Diagnosis not present

## 2016-05-25 DIAGNOSIS — G4733 Obstructive sleep apnea (adult) (pediatric): Secondary | ICD-10-CM | POA: Diagnosis not present

## 2016-05-28 DIAGNOSIS — L281 Prurigo nodularis: Secondary | ICD-10-CM | POA: Diagnosis not present

## 2016-05-31 ENCOUNTER — Ambulatory Visit: Payer: Medicare Other | Admitting: Family Medicine

## 2016-06-02 DIAGNOSIS — F17200 Nicotine dependence, unspecified, uncomplicated: Secondary | ICD-10-CM | POA: Diagnosis not present

## 2016-06-02 DIAGNOSIS — M1993 Secondary osteoarthritis, unspecified site: Secondary | ICD-10-CM | POA: Diagnosis not present

## 2016-06-02 DIAGNOSIS — Z8249 Family history of ischemic heart disease and other diseases of the circulatory system: Secondary | ICD-10-CM | POA: Diagnosis not present

## 2016-06-02 DIAGNOSIS — K219 Gastro-esophageal reflux disease without esophagitis: Secondary | ICD-10-CM | POA: Diagnosis not present

## 2016-06-02 DIAGNOSIS — I200 Unstable angina: Secondary | ICD-10-CM | POA: Diagnosis not present

## 2016-06-02 DIAGNOSIS — G4733 Obstructive sleep apnea (adult) (pediatric): Secondary | ICD-10-CM | POA: Diagnosis not present

## 2016-06-02 DIAGNOSIS — F419 Anxiety disorder, unspecified: Secondary | ICD-10-CM | POA: Diagnosis not present

## 2016-06-02 DIAGNOSIS — E785 Hyperlipidemia, unspecified: Secondary | ICD-10-CM | POA: Diagnosis not present

## 2016-06-02 DIAGNOSIS — I2510 Atherosclerotic heart disease of native coronary artery without angina pectoris: Secondary | ICD-10-CM | POA: Diagnosis not present

## 2016-06-10 ENCOUNTER — Ambulatory Visit
Admission: RE | Admit: 2016-06-10 | Discharge: 2016-06-10 | Disposition: A | Discharge status: Home / Self Care | Payer: Medicare Other | Source: Ambulatory Visit | Attending: Internal Medicine | Admitting: Internal Medicine

## 2016-06-10 ENCOUNTER — Encounter
Admission: RE | Disposition: A | Discharge status: Home / Self Care | Payer: Self-pay | Source: Ambulatory Visit | Attending: Internal Medicine

## 2016-06-10 ENCOUNTER — Encounter: Payer: Self-pay | Admitting: *Deleted

## 2016-06-10 DIAGNOSIS — Z7982 Long term (current) use of aspirin: Secondary | ICD-10-CM | POA: Diagnosis not present

## 2016-06-10 DIAGNOSIS — Z7951 Long term (current) use of inhaled steroids: Secondary | ICD-10-CM | POA: Insufficient documentation

## 2016-06-10 DIAGNOSIS — F17210 Nicotine dependence, cigarettes, uncomplicated: Secondary | ICD-10-CM | POA: Insufficient documentation

## 2016-06-10 DIAGNOSIS — F329 Major depressive disorder, single episode, unspecified: Secondary | ICD-10-CM | POA: Diagnosis not present

## 2016-06-10 DIAGNOSIS — Z79899 Other long term (current) drug therapy: Secondary | ICD-10-CM | POA: Diagnosis not present

## 2016-06-10 DIAGNOSIS — I2584 Coronary atherosclerosis due to calcified coronary lesion: Secondary | ICD-10-CM | POA: Insufficient documentation

## 2016-06-10 DIAGNOSIS — G4733 Obstructive sleep apnea (adult) (pediatric): Secondary | ICD-10-CM | POA: Insufficient documentation

## 2016-06-10 DIAGNOSIS — E785 Hyperlipidemia, unspecified: Secondary | ICD-10-CM | POA: Diagnosis not present

## 2016-06-10 DIAGNOSIS — F419 Anxiety disorder, unspecified: Secondary | ICD-10-CM | POA: Insufficient documentation

## 2016-06-10 DIAGNOSIS — K219 Gastro-esophageal reflux disease without esophagitis: Secondary | ICD-10-CM | POA: Insufficient documentation

## 2016-06-10 DIAGNOSIS — I25110 Atherosclerotic heart disease of native coronary artery with unstable angina pectoris: Secondary | ICD-10-CM | POA: Diagnosis not present

## 2016-06-10 DIAGNOSIS — J449 Chronic obstructive pulmonary disease, unspecified: Secondary | ICD-10-CM | POA: Insufficient documentation

## 2016-06-10 DIAGNOSIS — Z8249 Family history of ischemic heart disease and other diseases of the circulatory system: Secondary | ICD-10-CM | POA: Insufficient documentation

## 2016-06-10 DIAGNOSIS — I200 Unstable angina: Secondary | ICD-10-CM | POA: Diagnosis present

## 2016-06-10 HISTORY — DX: Hyperlipidemia, unspecified: E78.5

## 2016-06-10 HISTORY — DX: Depression, unspecified: F32.A

## 2016-06-10 HISTORY — PX: CARDIAC CATHETERIZATION: SHX172

## 2016-06-10 HISTORY — DX: Other specified disorders of bone density and structure, unspecified site: M85.80

## 2016-06-10 HISTORY — DX: Major depressive disorder, single episode, unspecified: F32.9

## 2016-06-10 LAB — CARDIAC CATHETERIZATION: Cath EF Quantitative: 55 %

## 2016-06-10 SURGERY — LEFT HEART CATH AND CORONARY ANGIOGRAPHY
Anesthesia: Moderate Sedation | Laterality: Left

## 2016-06-10 SURGERY — LEFT HEART CATH AND CORONARY ANGIOGRAPHY
Anesthesia: Moderate Sedation

## 2016-06-10 MED ORDER — FENTANYL CITRATE (PF) 100 MCG/2ML IJ SOLN
INTRAMUSCULAR | Status: AC
  Filled 2016-06-10: qty 2

## 2016-06-10 MED ORDER — SODIUM CHLORIDE 0.9% FLUSH: 3.0000 mL | INTRAVENOUS | Status: DC | PRN

## 2016-06-10 MED ORDER — ONDANSETRON HCL 4 MG/2ML IJ SOLN
4.0000 mg | Freq: Four times a day (QID) | INTRAMUSCULAR | Status: DC | PRN
Start: 2016-06-10 — End: 2016-06-11

## 2016-06-10 MED ORDER — HYDRALAZINE HCL 20 MG/ML IJ SOLN
INTRAMUSCULAR | Status: AC
  Filled 2016-06-10: qty 1

## 2016-06-10 MED ORDER — IOPAMIDOL (ISOVUE-300) INJECTION 61%
INTRAVENOUS | Status: DC | PRN
  Administered 2016-06-10: 90 mL via INTRA_ARTERIAL

## 2016-06-10 MED ORDER — SODIUM CHLORIDE 0.9 % WEIGHT BASED INFUSION: 1.0000 mL/kg/h | INTRAVENOUS | Status: DC

## 2016-06-10 MED ORDER — SODIUM CHLORIDE 0.9 % IV SOLN: 250.0000 mL | INTRAVENOUS | Status: DC | PRN

## 2016-06-10 MED ORDER — FENTANYL CITRATE (PF) 100 MCG/2ML IJ SOLN
INTRAMUSCULAR | Status: DC | PRN
  Administered 2016-06-10 (×2): 25 ug via INTRAVENOUS

## 2016-06-10 MED ORDER — HEPARIN (PORCINE) IN NACL 2-0.9 UNIT/ML-% IJ SOLN
INTRAMUSCULAR | Status: AC
  Filled 2016-06-10: qty 1000

## 2016-06-10 MED ORDER — MIDAZOLAM HCL 2 MG/2ML IJ SOLN
INTRAMUSCULAR | Status: DC | PRN
  Administered 2016-06-10 (×2): 1 mg via INTRAVENOUS

## 2016-06-10 MED ORDER — SODIUM CHLORIDE 0.9 % WEIGHT BASED INFUSION: 3.0000 mL/kg/h | INTRAVENOUS | Status: AC

## 2016-06-10 MED ORDER — ASPIRIN 81 MG PO CHEW: 81.0000 mg | CHEWABLE_TABLET | ORAL | Status: DC

## 2016-06-10 MED ORDER — MIDAZOLAM HCL 2 MG/2ML IJ SOLN
INTRAMUSCULAR | Status: AC
  Filled 2016-06-10: qty 2

## 2016-06-10 MED ORDER — SODIUM CHLORIDE 0.9% FLUSH: 3.0000 mL | Freq: Two times a day (BID) | INTRAVENOUS | Status: DC

## 2016-06-10 MED ORDER — SODIUM CHLORIDE 0.9 % WEIGHT BASED INFUSION: 3.0000 mL/kg/h | INTRAVENOUS | Status: DC

## 2016-06-10 MED ORDER — HYDRALAZINE HCL 20 MG/ML IJ SOLN
INTRAMUSCULAR | Status: DC | PRN
  Administered 2016-06-10: 10 mg via INTRAVENOUS

## 2016-06-10 MED ORDER — ACETAMINOPHEN 325 MG PO TABS: 650.0000 mg | ORAL_TABLET | ORAL | Status: DC | PRN

## 2016-06-10 MED ORDER — MIDAZOLAM HCL 2 MG/ML PO SYRP
5.0000 mg | ORAL_SOLUTION | Freq: Once | ORAL | Status: AC
  Administered 2016-06-10: 5 mg via ORAL
  Filled 2016-06-10: qty 4

## 2016-06-10 SURGICAL SUPPLY — 9 items
CATH INFINITI 5FR ANG PIGTAIL (CATHETERS) ×3 IMPLANT
CATH INFINITI 5FR JL4 (CATHETERS) ×3 IMPLANT
CATH INFINITI JR4 5F (CATHETERS) ×3 IMPLANT
DEVICE CLOSURE MYNXGRIP 5F (Vascular Products) ×3 IMPLANT
GUIDEWIRE 3MM J TIP .035 145 (WIRE) ×3 IMPLANT
KIT MANI 3VAL PERCEP (MISCELLANEOUS) ×3 IMPLANT
NEEDLE PERC 18GX7CM (NEEDLE) ×3 IMPLANT
PACK CARDIAC CATH (CUSTOM PROCEDURE TRAY) ×3 IMPLANT
SHEATH AVANTI 5FR X 11CM (SHEATH) ×3 IMPLANT

## 2016-06-11 ENCOUNTER — Encounter: Payer: Self-pay | Admitting: Internal Medicine

## 2016-06-17 DIAGNOSIS — G8929 Other chronic pain: Secondary | ICD-10-CM | POA: Diagnosis not present

## 2016-06-17 DIAGNOSIS — K219 Gastro-esophageal reflux disease without esophagitis: Secondary | ICD-10-CM | POA: Diagnosis not present

## 2016-06-17 DIAGNOSIS — F419 Anxiety disorder, unspecified: Secondary | ICD-10-CM | POA: Diagnosis not present

## 2016-06-17 DIAGNOSIS — E785 Hyperlipidemia, unspecified: Secondary | ICD-10-CM | POA: Diagnosis not present

## 2016-06-17 DIAGNOSIS — I2510 Atherosclerotic heart disease of native coronary artery without angina pectoris: Secondary | ICD-10-CM | POA: Diagnosis not present

## 2016-06-17 DIAGNOSIS — R52 Pain, unspecified: Secondary | ICD-10-CM | POA: Diagnosis not present

## 2016-06-17 DIAGNOSIS — Z8249 Family history of ischemic heart disease and other diseases of the circulatory system: Secondary | ICD-10-CM | POA: Diagnosis not present

## 2016-06-17 DIAGNOSIS — G4733 Obstructive sleep apnea (adult) (pediatric): Secondary | ICD-10-CM | POA: Diagnosis not present

## 2016-06-17 DIAGNOSIS — F17200 Nicotine dependence, unspecified, uncomplicated: Secondary | ICD-10-CM | POA: Diagnosis not present

## 2016-06-17 DIAGNOSIS — M1993 Secondary osteoarthritis, unspecified site: Secondary | ICD-10-CM | POA: Diagnosis not present

## 2016-06-24 ENCOUNTER — Other Ambulatory Visit: Payer: Self-pay | Admitting: Family Medicine

## 2016-06-24 DIAGNOSIS — J302 Other seasonal allergic rhinitis: Secondary | ICD-10-CM

## 2016-06-24 NOTE — Telephone Encounter (Signed)
Patient requesting refill of Flonase.

## 2016-06-29 DIAGNOSIS — F17210 Nicotine dependence, cigarettes, uncomplicated: Secondary | ICD-10-CM | POA: Diagnosis not present

## 2016-06-29 DIAGNOSIS — Z23 Encounter for immunization: Secondary | ICD-10-CM | POA: Diagnosis not present

## 2016-06-29 DIAGNOSIS — G4733 Obstructive sleep apnea (adult) (pediatric): Secondary | ICD-10-CM | POA: Diagnosis not present

## 2016-06-29 DIAGNOSIS — Z9989 Dependence on other enabling machines and devices: Secondary | ICD-10-CM | POA: Diagnosis not present

## 2016-06-29 DIAGNOSIS — G479 Sleep disorder, unspecified: Secondary | ICD-10-CM | POA: Diagnosis not present

## 2016-06-29 DIAGNOSIS — J449 Chronic obstructive pulmonary disease, unspecified: Secondary | ICD-10-CM | POA: Diagnosis not present

## 2016-07-13 DIAGNOSIS — M1612 Unilateral primary osteoarthritis, left hip: Secondary | ICD-10-CM | POA: Diagnosis not present

## 2016-07-13 DIAGNOSIS — Z79891 Long term (current) use of opiate analgesic: Secondary | ICD-10-CM | POA: Diagnosis not present

## 2016-07-13 DIAGNOSIS — M1611 Unilateral primary osteoarthritis, right hip: Secondary | ICD-10-CM | POA: Diagnosis not present

## 2016-07-13 DIAGNOSIS — G8929 Other chronic pain: Secondary | ICD-10-CM | POA: Diagnosis not present

## 2016-07-20 ENCOUNTER — Other Ambulatory Visit: Payer: Self-pay | Admitting: Family Medicine

## 2016-07-20 DIAGNOSIS — J302 Other seasonal allergic rhinitis: Secondary | ICD-10-CM

## 2016-07-26 ENCOUNTER — Ambulatory Visit: Payer: Medicare Other | Admitting: Family Medicine

## 2016-07-30 DIAGNOSIS — R030 Elevated blood-pressure reading, without diagnosis of hypertension: Secondary | ICD-10-CM | POA: Diagnosis not present

## 2016-07-30 DIAGNOSIS — Z9989 Dependence on other enabling machines and devices: Secondary | ICD-10-CM | POA: Diagnosis not present

## 2016-07-30 DIAGNOSIS — G4733 Obstructive sleep apnea (adult) (pediatric): Secondary | ICD-10-CM | POA: Diagnosis not present

## 2016-07-30 DIAGNOSIS — K219 Gastro-esophageal reflux disease without esophagitis: Secondary | ICD-10-CM | POA: Diagnosis not present

## 2016-07-30 DIAGNOSIS — L97911 Non-pressure chronic ulcer of unspecified part of right lower leg limited to breakdown of skin: Secondary | ICD-10-CM | POA: Diagnosis not present

## 2016-07-30 DIAGNOSIS — J449 Chronic obstructive pulmonary disease, unspecified: Secondary | ICD-10-CM | POA: Diagnosis not present

## 2016-07-30 DIAGNOSIS — L97921 Non-pressure chronic ulcer of unspecified part of left lower leg limited to breakdown of skin: Secondary | ICD-10-CM | POA: Diagnosis not present

## 2016-07-30 DIAGNOSIS — Z131 Encounter for screening for diabetes mellitus: Secondary | ICD-10-CM | POA: Diagnosis not present

## 2016-07-30 DIAGNOSIS — E7800 Pure hypercholesterolemia, unspecified: Secondary | ICD-10-CM | POA: Diagnosis not present

## 2016-07-31 DIAGNOSIS — R030 Elevated blood-pressure reading, without diagnosis of hypertension: Secondary | ICD-10-CM | POA: Insufficient documentation

## 2016-07-31 DIAGNOSIS — E7800 Pure hypercholesterolemia, unspecified: Secondary | ICD-10-CM | POA: Insufficient documentation

## 2016-08-01 ENCOUNTER — Other Ambulatory Visit: Payer: Self-pay | Admitting: Family Medicine

## 2016-08-01 DIAGNOSIS — E785 Hyperlipidemia, unspecified: Secondary | ICD-10-CM

## 2016-08-02 NOTE — Telephone Encounter (Signed)
Patient requesting refill of Atorvastatin to [facility].  

## 2016-08-04 DIAGNOSIS — L281 Prurigo nodularis: Secondary | ICD-10-CM | POA: Diagnosis not present

## 2016-08-04 DIAGNOSIS — L821 Other seborrheic keratosis: Secondary | ICD-10-CM | POA: Diagnosis not present

## 2016-08-04 DIAGNOSIS — Z1159 Encounter for screening for other viral diseases: Secondary | ICD-10-CM | POA: Diagnosis not present

## 2016-08-04 DIAGNOSIS — D239 Other benign neoplasm of skin, unspecified: Secondary | ICD-10-CM | POA: Diagnosis not present

## 2016-08-04 DIAGNOSIS — L814 Other melanin hyperpigmentation: Secondary | ICD-10-CM | POA: Diagnosis not present

## 2016-08-04 DIAGNOSIS — Z1283 Encounter for screening for malignant neoplasm of skin: Secondary | ICD-10-CM | POA: Diagnosis not present

## 2016-08-04 DIAGNOSIS — D2270 Melanocytic nevi of unspecified lower limb, including hip: Secondary | ICD-10-CM | POA: Diagnosis not present

## 2016-08-04 DIAGNOSIS — L282 Other prurigo: Secondary | ICD-10-CM | POA: Diagnosis not present

## 2016-08-04 DIAGNOSIS — D225 Melanocytic nevi of trunk: Secondary | ICD-10-CM | POA: Diagnosis not present

## 2016-08-04 DIAGNOSIS — L853 Xerosis cutis: Secondary | ICD-10-CM | POA: Diagnosis not present

## 2016-08-04 DIAGNOSIS — D485 Neoplasm of uncertain behavior of skin: Secondary | ICD-10-CM | POA: Diagnosis not present

## 2016-08-04 DIAGNOSIS — L578 Other skin changes due to chronic exposure to nonionizing radiation: Secondary | ICD-10-CM | POA: Diagnosis not present

## 2016-08-04 DIAGNOSIS — I781 Nevus, non-neoplastic: Secondary | ICD-10-CM | POA: Diagnosis not present

## 2016-08-04 DIAGNOSIS — D2260 Melanocytic nevi of unspecified upper limb, including shoulder: Secondary | ICD-10-CM | POA: Diagnosis not present

## 2016-09-10 DIAGNOSIS — L981 Factitial dermatitis: Secondary | ICD-10-CM | POA: Insufficient documentation

## 2016-09-29 DIAGNOSIS — L570 Actinic keratosis: Secondary | ICD-10-CM | POA: Insufficient documentation

## 2016-10-12 DIAGNOSIS — G8929 Other chronic pain: Secondary | ICD-10-CM | POA: Diagnosis not present

## 2016-10-12 DIAGNOSIS — Z79891 Long term (current) use of opiate analgesic: Secondary | ICD-10-CM | POA: Diagnosis not present

## 2016-10-12 DIAGNOSIS — M4722 Other spondylosis with radiculopathy, cervical region: Secondary | ICD-10-CM | POA: Diagnosis not present

## 2016-10-12 DIAGNOSIS — M791 Myalgia: Secondary | ICD-10-CM | POA: Diagnosis not present

## 2016-11-02 DIAGNOSIS — I10 Essential (primary) hypertension: Secondary | ICD-10-CM | POA: Insufficient documentation

## 2016-11-10 DIAGNOSIS — L97911 Non-pressure chronic ulcer of unspecified part of right lower leg limited to breakdown of skin: Secondary | ICD-10-CM | POA: Insufficient documentation

## 2016-11-14 DIAGNOSIS — L299 Pruritus, unspecified: Secondary | ICD-10-CM | POA: Insufficient documentation

## 2016-12-13 DIAGNOSIS — R600 Localized edema: Secondary | ICD-10-CM | POA: Insufficient documentation

## 2017-01-06 DIAGNOSIS — Z79891 Long term (current) use of opiate analgesic: Secondary | ICD-10-CM | POA: Diagnosis not present

## 2017-02-14 DIAGNOSIS — I10 Essential (primary) hypertension: Secondary | ICD-10-CM | POA: Insufficient documentation

## 2017-02-27 DIAGNOSIS — R296 Repeated falls: Secondary | ICD-10-CM | POA: Insufficient documentation

## 2017-02-27 DIAGNOSIS — R2689 Other abnormalities of gait and mobility: Secondary | ICD-10-CM | POA: Insufficient documentation

## 2017-03-02 DIAGNOSIS — I872 Venous insufficiency (chronic) (peripheral): Secondary | ICD-10-CM | POA: Insufficient documentation

## 2017-05-04 DIAGNOSIS — L98491 Non-pressure chronic ulcer of skin of other sites limited to breakdown of skin: Secondary | ICD-10-CM | POA: Insufficient documentation

## 2017-05-06 ENCOUNTER — Other Ambulatory Visit: Payer: Self-pay | Admitting: Internal Medicine

## 2017-05-06 DIAGNOSIS — Z1231 Encounter for screening mammogram for malignant neoplasm of breast: Secondary | ICD-10-CM

## 2017-05-11 ENCOUNTER — Ambulatory Visit
Admission: RE | Admit: 2017-05-11 | Discharge: 2017-05-11 | Disposition: A | Discharge status: Home / Self Care | Payer: Medicare HMO | Source: Ambulatory Visit | Attending: Internal Medicine | Admitting: Internal Medicine

## 2017-05-11 DIAGNOSIS — Z1231 Encounter for screening mammogram for malignant neoplasm of breast: Secondary | ICD-10-CM | POA: Diagnosis not present

## 2017-09-05 ENCOUNTER — Ambulatory Visit: Payer: Medicare Other

## 2017-10-24 DIAGNOSIS — I10 Essential (primary) hypertension: Secondary | ICD-10-CM | POA: Diagnosis present

## 2017-11-28 ENCOUNTER — Encounter: Payer: Self-pay | Admitting: Podiatry

## 2017-11-28 ENCOUNTER — Ambulatory Visit (INDEPENDENT_AMBULATORY_CARE_PROVIDER_SITE_OTHER): Payer: Medicare HMO

## 2017-11-28 ENCOUNTER — Ambulatory Visit (INDEPENDENT_AMBULATORY_CARE_PROVIDER_SITE_OTHER): Payer: Medicare HMO | Admitting: Podiatry

## 2017-11-28 DIAGNOSIS — M898X9 Other specified disorders of bone, unspecified site: Secondary | ICD-10-CM

## 2017-11-28 DIAGNOSIS — M2012 Hallux valgus (acquired), left foot: Secondary | ICD-10-CM

## 2017-11-28 DIAGNOSIS — M2010 Hallux valgus (acquired), unspecified foot: Secondary | ICD-10-CM

## 2017-11-28 DIAGNOSIS — M2042 Other hammer toe(s) (acquired), left foot: Secondary | ICD-10-CM | POA: Diagnosis not present

## 2017-11-28 NOTE — Patient Instructions (Signed)
Pre-Operative Instructions  Congratulations, you have decided to take an important step towards improving your quality of life.  You can be assured that the doctors and staff at [facility] will be with you every step of the way.  Here are some important things you should know:  1. Plan to be at the surgery center/hospital at least 1 (one) hour prior to your scheduled time, unless otherwise directed by the surgical center/hospital staff.  You must have a responsible adult accompany you, remain during the surgery and drive you home.  Make sure you have directions to the surgical center/hospital to ensure you arrive on time. 2. If you are having surgery at [facility] or [facility] hospitals, you will need a copy of your medical history and physical form from your family physician within one month prior to the date of surgery. We will give you a form for your primary physician to complete.  3. We make every effort to accommodate the date you request for surgery.  However, there are times where surgery dates or times have to be moved.  We will contact you as soon as possible if a change in schedule is required.   4. No aspirin/ibuprofen for one week before surgery.  If you are on aspirin, any non-steroidal anti-inflammatory medications (Mobic, Aleve, Ibuprofen) should not be taken seven (7) days prior to your surgery.  You make take Tylenol for pain prior to surgery.  5. Medications - If you are taking daily heart and blood pressure medications, seizure, reflux, allergy, asthma, anxiety, pain or diabetes medications, make sure you notify the surgery center/hospital before the day of surgery so they can tell you which medications you should take or avoid the day of surgery. 6. No food or drink after midnight the night before surgery unless directed otherwise by surgical center/hospital staff. 7. No alcoholic beverages 24-hours prior to surgery.  No smoking 24-hours prior or 24-hours after  surgery. 8. Wear loose pants or shorts. They should be loose enough to fit over bandages, boots, and casts. 9. Don't wear slip-on shoes. Sneakers are preferred. 10. Bring your boot with you to the surgery center/hospital.  Also bring crutches or a walker if your physician has prescribed it for you.  If you do not have this equipment, it will be provided for you after surgery. 11. If you have not been contacted by the surgery center/hospital by the day before your surgery, call to confirm the date and time of your surgery. 12. Leave-time from work may vary depending on the type of surgery you have.  Appropriate arrangements should be made prior to surgery with your employer. 13. Prescriptions will be provided immediately following surgery by your doctor.  Fill these as soon as possible after surgery and take the medication as directed. Pain medications will not be refilled on weekends and must be approved by the doctor. 14. Remove nail polish on the operative foot and avoid getting pedicures prior to surgery. 15. Wash the night before surgery.  The night before surgery wash the foot and leg well with water and the antibacterial soap provided. Be sure to pay special attention to beneath the toenails and in between the toes.  Wash for at least three (3) minutes. Rinse thoroughly with water and dry well with a towel.  Perform this wash unless told not to do so by your physician.  Enclosed: 1 Ice pack (please put in freezer the night before surgery)   1 Hibiclens skin cleaner     Pre-op instructions  If you have any questions regarding the instructions, please do not hesitate to call our office.  [place]: [address], [place], [place] [address] -- [phone]  [place]: [address] [place], [place] [address] -- [phone]  [place]: [address]  [place], [place] [address] -- [phone]  [place]: [address], [address], [place], [place] [address] -- [phone]  Website:  [email or web address] 

## 2017-11-28 NOTE — Progress Notes (Signed)
Subjective:  Patient ID: Kim Farmer, female    DOB: 1950-08-21,  MRN: [id number] HPI Chief Complaint  Patient presents with  . Bunions    Patient presents today for bilat painful hallux bunions x 20+ years.  She reports they are progressivly getting worse in the past couple of years and is painful to wear shoes.  She reports this is causing her to have painful corns on lt 4th,5th toes   She has been wearing OTC toe seperators which have helped some  . New Patient (Initial Visit)    67 y.o. female presents with the above complaint.   ROS: Denies fever chills nausea vomiting muscle aches pains calf pain back pain chest pain shortness of breath and headache.  Past Medical History:  Diagnosis Date  . Anxiety   . Arthritis    back, hands, feet  . Asthma   . Barrett esophagus   . Bulging lumbar disc   . Chronic pain   . COPD (chronic obstructive pulmonary disease) ([place])   . Degenerative disc disease, cervical    limited left turn  . Depression   . GERD (gastroesophageal reflux disease)   . Hepatitis    age 62 or 25. hospitalized.   . Hiatal hernia   . Hyperlipidemia   . Osteopenia   . Presence of dental bridge    permanent upper  . Sciatica   . Sleep apnea    CPAP   Past Surgical History:  Procedure Laterality Date  . ABDOMINAL HYSTERECTOMY    . AUGMENTATION MAMMAPLASTY Bilateral 2001  . BREAST SURGERY     saline implants  . CARDIAC CATHETERIZATION Left 06/10/2016   Procedure: Left Heart Cath and Coronary Angiography;  Surgeon: Yolonda Kida, MD;  Location: [facility];  Service: Cardiovascular;  Laterality: Left;  . ESOPHAGEAL DILATION    . ESOPHAGOGASTRODUODENOSCOPY (EGD) WITH PROPOFOL N/A 08/07/2015   Procedure: ESOPHAGOGASTRODUODENOSCOPY (EGD) WITH PROPOFOL with balloon dilation;  Surgeon: Lucilla Lame, MD;  Location: [facility];  Service: Endoscopy;  Laterality: N/A;  . EYE SURGERY    . HAMMER TOE SURGERY Right     Current Outpatient  Medications:  .  albuterol (PROAIR HFA) 108 (90 BASE) MCG/ACT inhaler, Inhale 1 puff into the lungs every 6 (six) hours as needed. , Disp: , Rfl:  .  ALPRAZolam (XANAX) 1 MG tablet, Take 1 mg by mouth 3 (three) times daily., Disp: , Rfl:  .  Armodafinil 250 MG tablet, TK 1 T PO QD, Disp: , Rfl: 5 .  aspirin EC 81 MG tablet, Take 1 tablet (81 mg total) by mouth daily., Disp: 30 tablet, Rfl: 0 .  atorvastatin (LIPITOR) 40 MG tablet, TAKE 1 TABLET(40 MG) BY MOUTH DAILY, Disp: 30 tablet, Rfl: 5 .  Calcium-Vitamin D 600-200 MG-UNIT tablet, Take 1 tablet by mouth daily., Disp: , Rfl:  .  citalopram (CELEXA) 20 MG tablet, Take 1 tablet (20 mg total) by mouth daily., Disp: 30 tablet, Rfl: 5 .  fluticasone (FLONASE) 50 MCG/ACT nasal spray, SHAKE LIQUID AND USE 2 SPRAYS IN EACH NOSTRIL DAILY, Disp: 16 g, Rfl: 0 .  Omega-3 Fatty Acids (FISH OIL) 1000 MG CAPS, Take 1 capsule by mouth daily. Reported on 08/07/2015, Disp: , Rfl:  .  omeprazole (PRILOSEC) 20 MG capsule, Take by mouth daily. , Disp: , Rfl:  .  oxycodone (ROXICODONE) 30 MG immediate release tablet, Take 15 mg by mouth every 4 (four) hours as needed for pain., Disp: , Rfl:  .  Oxycodone HCl 20 MG TABS, Take 1 tablet by mouth every 4 (four) hours as needed., Disp: , Rfl: 0 .  RESTASIS 0.05 % ophthalmic emulsion, Place 1 drop into both eyes 2 (two) times daily. , Disp: , Rfl: 0 .  triamcinolone ointment (KENALOG) 0.1 %, Reported on 11/04/2015, Disp: , Rfl:  .  umeclidinium-vilanterol (ANORO ELLIPTA) 62.5-25 MCG/INH AEPB, Inhale 1 puff into the lungs daily., Disp: 60 each, Rfl: 0 .  valACYclovir (VALTREX) 1000 MG tablet, Take 1,000 mg by mouth daily as needed. , Disp: , Rfl: 3 .  VOLTAREN 1 % GEL, Apply 1 application topically daily. Reported on 11/04/2015, Disp: , Rfl: 1  Allergies  Allergen Reactions  . Penicillins Shortness Of Breath    Throat swells   Review of Systems Objective:  There were no vitals filed for this visit.  General: Well  developed, nourished, in no acute distress, alert and oriented x3   Dermatological: Skin is warm, dry and supple bilateral. Nails x 10 are well maintained; remaining integument appears unremarkable at this time. There are no open sores, no preulcerative lesions, no rash or signs of infection present.  Vascular: Dorsalis Pedis artery and Posterior Tibial artery pedal pulses are 2/4 bilateral with immedate capillary fill time. Pedal hair growth present. No varicosities and no lower extremity edema present bilateral.   Neruologic: Grossly intact via light touch bilateral. Vibratory intact via tuning fork bilateral. Protective threshold with Semmes Wienstein monofilament intact to all pedal sites bilateral. Patellar and Achilles deep tendon reflexes 2+ bilateral. No Babinski or clonus noted bilateral.   Musculoskeletal: No gross boney pedal deformities bilateral. No pain, crepitus, or limitation noted with foot and ankle range of motion bilateral. Muscular strength 5/5 in all groups tested bilateral.  Severe hallux abductovalgus deformity bilateral.  Hammertoe deformity second left.  Hammertoe deformity fifth bilateral left greater than right.  Exostosis medial aspect fifth digit left these bunions are not reducible there are track bound and there greater than 15 degrees based on radiographs.  Gait: Unassisted, Nonantalgic.    Radiographs:  Radiographs taken today demonstrate severe hallux abductovalgus deformities bilateral hammertoe deformity second left and fifth left.  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  Plan: Discussed etiology pathology conservative versus surgical therapies.  At this point we consented her for surgical intervention regarding her left foot.  This will consist of a Lapidus procedure with an bunion repair.  Hammertoe repair more than likely arthroplasty second PIPJ fifth PIPJ and exostectomy fifth toe.  We  will also provide her with a cast.  We did discuss the possible postop complications which may include but are not limited to postop pain bleeding swelling infection recurrence need for further surgery overcorrection under correction loss of digit loss of limb loss of life.  We will also get cardiac and pulmonary clearance prior to surgery.     Max T. [place], [place]

## 2017-12-21 ENCOUNTER — Encounter: Payer: Self-pay | Admitting: *Deleted

## 2017-12-26 ENCOUNTER — Telehealth: Payer: Self-pay | Admitting: *Deleted

## 2017-12-26 NOTE — Telephone Encounter (Signed)
"  I haven't heard anything about how much my insurance will pay.  I need to know how much I am going to have to pay out of pocket.  This is a new insurance for me, it's [employer]."  I will get Jocelyn Lamer in our insurance department to give you a call and she will give you an estimate.  You will have to call the surgical center to get their fees as well as the anesthesia fees.  Their phone number is on the back of the brochure that was givenAnesthesia will need to know how long your surgery will take.  Your surgery will be two hours.  "Okay, thank you so much.  I just got this [employer] so I don't know much about it.  This will help me determine if I need to reschedule it or not."  (Lapidus Procedure including bunionectomy; Hammer Toe Repair 2,5; Exostectomy 5th left foot)

## 2017-12-29 ENCOUNTER — Telehealth: Payer: Self-pay | Admitting: *Deleted

## 2017-12-29 NOTE — Telephone Encounter (Signed)
"  I need to cancel my surgery for the 31st.  I got some questions about it too but I'm going to have to cancel it right now because I can't afford it.  I think the price they left me was for one foot and I got to have both feet done.  If you could, call me back at your earliest convenience."

## 2018-01-02 NOTE — Telephone Encounter (Signed)
I am calling from Dr. Stephenie Acres office returning your call.  "Yes, I want to cancel my surgery because I can't afford it.  I am on a fixed income.  I only get $1200 a month.  The surgical center said I would have to pay $200 to them when I have the surgery, I would have to pay $200 to anesthesia, and $200 to you all up front.  I can't do it.  I know I need the surgery and that I could eventually be crippled but I just can't afford it.  I am going to call my insurance company and see what I can work out but for now, I just have to cancel it.  For the past two years I hadn't had to use my [employer] because most of my procedures were covered by [facility]."  You don't have to pay [place] anything up front, we will file it then, when you get a statement you can arrange to make payments.  "No, I just want to cancel it for right now.  I don't want to get myself in a mess."  I will cancel your surgery at the surgical center and let Dr. Milinda Pointer know.  I called Caren Griffins at the surgical center and canceled the surgery.

## 2018-01-02 NOTE — Telephone Encounter (Signed)
I cancelled the pt's post op visits to reflect she cancelled her surgery.

## 2018-01-18 ENCOUNTER — Encounter: Payer: Medicare HMO | Admitting: Podiatry

## 2018-01-25 ENCOUNTER — Encounter: Payer: Medicare HMO | Admitting: Podiatry

## 2018-01-27 ENCOUNTER — Other Ambulatory Visit: Payer: Self-pay | Admitting: Specialist

## 2018-01-27 DIAGNOSIS — R918 Other nonspecific abnormal finding of lung field: Secondary | ICD-10-CM

## 2018-01-31 ENCOUNTER — Ambulatory Visit: Payer: Medicare HMO

## 2018-06-19 ENCOUNTER — Other Ambulatory Visit: Payer: Self-pay | Admitting: Internal Medicine

## 2018-06-19 DIAGNOSIS — Z1231 Encounter for screening mammogram for malignant neoplasm of breast: Secondary | ICD-10-CM

## 2018-07-12 ENCOUNTER — Ambulatory Visit
Admission: RE | Admit: 2018-07-12 | Discharge: 2018-07-12 | Disposition: A | Discharge status: Home / Self Care | Payer: Medicare HMO | Source: Ambulatory Visit | Attending: Internal Medicine | Admitting: Internal Medicine

## 2018-07-12 DIAGNOSIS — Z1231 Encounter for screening mammogram for malignant neoplasm of breast: Secondary | ICD-10-CM | POA: Insufficient documentation

## 2018-07-14 ENCOUNTER — Emergency Department: Payer: Medicare HMO

## 2018-07-14 ENCOUNTER — Emergency Department
Admission: EM | Admit: 2018-07-14 | Discharge: 2018-07-15 | Disposition: A | Discharge status: Home / Self Care | Payer: Medicare HMO | Attending: Emergency Medicine | Admitting: Emergency Medicine

## 2018-07-14 DIAGNOSIS — Z79899 Other long term (current) drug therapy: Secondary | ICD-10-CM | POA: Insufficient documentation

## 2018-07-14 DIAGNOSIS — I10 Essential (primary) hypertension: Secondary | ICD-10-CM | POA: Diagnosis not present

## 2018-07-14 DIAGNOSIS — R0602 Shortness of breath: Secondary | ICD-10-CM | POA: Insufficient documentation

## 2018-07-14 DIAGNOSIS — F17210 Nicotine dependence, cigarettes, uncomplicated: Secondary | ICD-10-CM | POA: Insufficient documentation

## 2018-07-14 DIAGNOSIS — Z7982 Long term (current) use of aspirin: Secondary | ICD-10-CM | POA: Insufficient documentation

## 2018-07-14 DIAGNOSIS — J45909 Unspecified asthma, uncomplicated: Secondary | ICD-10-CM | POA: Diagnosis not present

## 2018-07-14 DIAGNOSIS — R0781 Pleurodynia: Secondary | ICD-10-CM | POA: Insufficient documentation

## 2018-07-14 DIAGNOSIS — J441 Chronic obstructive pulmonary disease with (acute) exacerbation: Secondary | ICD-10-CM | POA: Insufficient documentation

## 2018-07-14 DIAGNOSIS — R079 Chest pain, unspecified: Secondary | ICD-10-CM

## 2018-07-14 LAB — FIBRIN DERIVATIVES D-DIMER (ARMC ONLY): Fibrin derivatives D-dimer (ARMC): 1157.13 ng/mL (FEU) — ABNORMAL HIGH (ref 0.00–499.00)

## 2018-07-14 LAB — BASIC METABOLIC PANEL
Anion gap: 6 (ref 5–15)
BUN: 12 mg/dL (ref 8–23)
CHLORIDE: 102 mmol/L (ref 98–111)
CO2: 29 mmol/L (ref 22–32)
Calcium: 9.1 mg/dL (ref 8.9–10.3)
Creatinine, Ser: 0.6 mg/dL (ref 0.44–1.00)
GFR calc non Af Amer: >60 mL/min (ref >60)
Glucose, Bld: 135 mg/dL — ABNORMAL HIGH (ref 70–99)
POTASSIUM: 4 mmol/L (ref 3.5–5.1)
Sodium: 137 mmol/L (ref 135–145)

## 2018-07-14 LAB — CBC
HCT: 37.2 % (ref 36.0–46.0)
HEMOGLOBIN: 12.6 g/dL (ref 12.0–15.0)
MCH: 30.8 pg (ref 26.0–34.0)
MCHC: 33.9 g/dL (ref 30.0–36.0)
MCV: 91 fL (ref 80.0–100.0)
NRBC: 0 % (ref 0.0–0.2)
Platelets: 298 10*3/uL (ref 150–400)
RBC: 4.09 MIL/uL (ref 3.87–5.11)
RDW: 11.6 % (ref 11.5–15.5)
WBC: 9.6 10*3/uL (ref 4.0–10.5)

## 2018-07-14 LAB — TROPONIN I: Troponin I: <0.03 ng/mL (ref <0.03)

## 2018-07-14 LAB — BRAIN NATRIURETIC PEPTIDE: B NATRIURETIC PEPTIDE 5: 119 pg/mL — AB (ref 0.0–100.0)

## 2018-07-14 MED ORDER — IPRATROPIUM-ALBUTEROL 0.5-2.5 (3) MG/3ML IN SOLN
3.0000 mL | Freq: Once | RESPIRATORY_TRACT | Status: AC
  Administered 2018-07-14: 3 mL via RESPIRATORY_TRACT
  Filled 2018-07-14: qty 3

## 2018-07-14 MED ORDER — LEVOFLOXACIN 500 MG PO TABS
500.0000 mg | ORAL_TABLET | Freq: Once | ORAL | Status: AC
  Administered 2018-07-14: 500 mg via ORAL
  Filled 2018-07-14: qty 1

## 2018-07-14 MED ORDER — IOHEXOL 350 MG/ML SOLN
75.0000 mL | Freq: Once | INTRAVENOUS | Status: AC | PRN
  Administered 2018-07-14: 75 mL via INTRAVENOUS

## 2018-07-14 MED ORDER — OXYCODONE HCL 5 MG PO TABS
15.0000 mg | ORAL_TABLET | ORAL | Status: AC
  Administered 2018-07-14: 15 mg via ORAL
  Filled 2018-07-14: qty 3

## 2018-07-14 MED ORDER — LEVOFLOXACIN 500 MG PO TABS: 500.0000 mg | ORAL_TABLET | Freq: Every day | ORAL | 0 refills | Status: AC

## 2018-07-14 NOTE — Discharge Instructions (Signed)
Use your inhaler as needed.  Take the Levaquin once a day.  You can stop the doxycycline for now.  Return for increasing pain shortness of breath or any other problems.  See 1 of your doctors this coming Monday.

## 2018-07-14 NOTE — ED Triage Notes (Addendum)
Patient c/o chest pain described as squeezing, radiating to back. Patient c/o SOB, nausea, dizziness, weakness, and productive cough with brown sputum.

## 2018-07-14 NOTE — ED Provider Notes (Addendum)
[facility] Emergency Department Provider Note   ____________________________________________   First MD Initiated Contact with Patient 07/14/18 2058     (approximate)  I have reviewed the triage vital signs and the nursing notes.   HISTORY  Chief Complaint Chest Pain   HPI Kim Farmer is a 67 y.o. female patient complains of some pleuritic left-sided chest pain.  She is coughing up thick brown phlegm and occasional little bit of pink phlegm.  She is not running a fever.  Having some shortness of breath and weakness.  Saw Dr. Raul Del little over a week ago and he felt she had pneumonia gave her some doxycycline she got better for a bit and is getting worse again.  She has COPD and asthma as well.   Past Medical History:  Diagnosis Date  . Anxiety   . Arthritis    back, hands, feet  . Asthma   . Barrett esophagus   . Bulging lumbar disc   . Chronic pain   . COPD (chronic obstructive pulmonary disease) ([place])   . Degenerative disc disease, cervical    limited left turn  . Depression   . GERD (gastroesophageal reflux disease)   . Hepatitis    age 80 or 25. hospitalized.   . Hiatal hernia   . Hyperlipidemia   . Osteopenia   . Presence of dental bridge    permanent upper  . Sciatica   . Sleep apnea    CPAP    Patient Active Problem List   Diagnosis Date Noted  . Skin ulcer of face, limited to breakdown of skin ([place]) 05/04/2017  . Venous insufficiency (chronic) (peripheral) 03/02/2017  . Frequent falls 02/27/2017  . Imbalance 02/27/2017  . Well-controlled hypertension 02/14/2017  . Bilateral leg edema 12/13/2016  . Itching 11/14/2016  . Leg ulcer, right, limited to breakdown of skin ([place]) 11/10/2016  . Severe uncontrolled hypertension 11/02/2016  . Actinic keratoses 09/29/2016  . Neurotic excoriations 09/10/2016  . Elevated blood pressure reading 07/31/2016  . Pure hypercholesterolemia 07/31/2016  . Coronary artery calcification seen  on CT scan 11/04/2015  . History of esophageal stricture 10/28/2015  . Lung nodule, solitary 10/28/2015  . Allergic rhinitis, seasonal 10/10/2015  . Other specified diseases of esophagus   . Problems with swallowing and mastication   . Centrilobular emphysema ([place]) 06/16/2015  . Cervical radiculitis 06/16/2015  . DDD (degenerative disc disease), lumbar 06/16/2015  . Osteoarthritis 06/16/2015  . Chronic pain 06/16/2015  . Bunion 06/16/2015  . GAD (generalized anxiety disorder) 06/16/2015  . Lumbar radiculitis 06/16/2015  . Dyslipidemia 06/16/2015  . Hiatal hernia 06/16/2015  . GERD without esophagitis 06/16/2015  . 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 History of skin cancer 06/16/2015  . OSA on CPAP 06/16/2015  . Tobacco use disorder, continuous 12/17/2014  . Disturbance in sleep behavior 12/17/2014  . Cigarette nicotine dependence without complication 54/49/2010  . Hypokalemia 01/10/2014  . Intractable vomiting 01/09/2014    Past Surgical History:  Procedure Laterality Date  . ABDOMINAL HYSTERECTOMY    . AUGMENTATION MAMMAPLASTY Bilateral 2001  . BREAST SURGERY     saline implants  . CARDIAC CATHETERIZATION Left 06/10/2016   Procedure: Left Heart Cath and Coronary Angiography;  Surgeon: Yolonda Kida, MD;  Location: [facility];  Service: Cardiovascular;  Laterality: Left;  . ESOPHAGEAL DILATION    . ESOPHAGOGASTRODUODENOSCOPY (EGD) WITH PROPOFOL N/A 08/07/2015   Procedure: ESOPHAGOGASTRODUODENOSCOPY (EGD) WITH PROPOFOL with  balloon dilation;  Surgeon: Lucilla Lame, MD;  Location: [facility];  Service: Endoscopy;  Laterality: N/A;  . EYE SURGERY    . HAMMER TOE SURGERY Right     Prior to Admission medications   Medication Sig Start Date End Date Taking? Authorizing Provider  albuterol (PROAIR HFA) 108 (90 BASE) MCG/ACT inhaler Inhale 1 puff into the lungs every 6 (six) hours as needed.  05/23/15   [provider]  ALPRAZolam Duanne Moron) 1 MG tablet Take 1 mg by mouth 3 (three) times daily.    [provider]  Armodafinil 250 MG tablet TK 1 T PO QD 02/02/16   [provider]  aspirin EC 81 MG tablet Take 1 tablet (81 mg total) by mouth daily. 11/04/15   Steele Sizer, MD  atorvastatin (LIPITOR) 40 MG tablet TAKE 1 TABLET(40 MG) BY MOUTH DAILY 02/27/16   Steele Sizer, MD  Calcium-Vitamin D 600-200 MG-UNIT tablet Take 1 tablet by mouth daily.    [provider]  citalopram (CELEXA) 20 MG tablet Take 1 tablet (20 mg total) by mouth daily. 02/27/16   Steele Sizer, MD  fluticasone (FLONASE) 50 MCG/ACT nasal spray SHAKE LIQUID AND USE 2 SPRAYS IN EACH NOSTRIL DAILY 06/24/16   Ancil Boozer, Drue Stager, MD  levofloxacin (LEVAQUIN) 500 MG tablet Take 1 tablet (500 mg total) by mouth daily for 10 days. 07/14/18 07/24/18  Nena Polio, MD  Omega-3 Fatty Acids (FISH OIL) 1000 MG CAPS Take 1 capsule by mouth daily. Reported on 08/07/2015    [provider]  omeprazole (PRILOSEC) 20 MG capsule Take by mouth daily.  05/26/15   [provider]  oxycodone (ROXICODONE) 30 MG immediate release tablet Take 15 mg by mouth every 4 (four) hours as needed for pain.    [provider]  Oxycodone HCl 20 MG TABS Take 1 tablet by mouth every 4 (four) hours as needed. 04/18/15   [provider]  RESTASIS 0.05 % ophthalmic emulsion Place 1 drop into both eyes 2 (two) times daily.  07/17/15   [provider]  triamcinolone ointment (KENALOG) 0.1 % Reported on 11/04/2015 07/01/15   [provider]  umeclidinium-vilanterol (ANORO ELLIPTA) 62.5-25 MCG/INH AEPB Inhale 1 puff into the lungs daily. 02/27/16   Steele Sizer, MD  valACYclovir (VALTREX) 1000 MG tablet Take 1,000 mg by mouth daily as needed.  03/13/15   [provider]  VOLTAREN 1 % GEL Apply 1 application topically daily. Reported on 11/04/2015 05/09/15   [provider]     Allergies Penicillins  Family History  Problem Relation Age of Onset  . Diabetes Mother   . Hypertension Mother   . Diabetes Father   . Hypertension Father   . COPD Sister   . Diabetes Sister   . Emphysema Sister     Social History Social History   Tobacco Use  . Smoking status: Current Every Day Smoker    Packs/day: 0.50    Years: 40.00    Pack years: 20.00    Types: Cigarettes    Start date: 06/16/1975  . Smokeless tobacco: Never Used  Substance Use Topics  . Alcohol use: Yes    Alcohol/week: 0.0 standard drinks    Comment: 2 beers today - daughter states the patient drinks regularly and heavily  . Drug use: No    Review of Systems  Constitutional: No fever/chills Eyes: No visual changes. ENT: No sore throat. Cardiovascular see HPI Respiratory:  shortness of breath. Gastrointestinal: No abdominal pain.  No nausea, no vomiting.  No diarrhea.  No constipation. Genitourinary: Negative for dysuria. Musculoskeletal: Negative for back pain. Skin: Negative for rash. Neurological: Negative for headaches, focal weakness   ____________________________________________   PHYSICAL EXAM:  VITAL SIGNS: ED Triage Vitals  Enc Vitals Group     BP 07/14/18 1748 134/72     Pulse Rate 07/14/18 1748 69     Resp 07/14/18 2044 18     Temp 07/14/18 1748 98.1 F (36.7 C)     Temp Source 07/14/18 1748 Oral     SpO2 07/14/18 1748 94 %     Weight 07/14/18 1750 148 lb (67.1 kg)     Height 07/14/18 1750 5\' 2"  (1.575 m)     Head Circumference --      Peak Flow --      Pain Score 07/14/18 1755 5     Pain Loc --      Pain Edu? --      Excl. in [place]? --     Constitutional: Alert and oriented. Well appearing and in no acute distress. Eyes: Conjunctivae are normal.  Head: Atraumatic. Nose: No congestion/rhinnorhea. Mouth/Throat: Mucous membranes are moist.  Oropharynx non-erythematous. Neck: No stridor.  Cardiovascular: Normal rate, regular rhythm. Grossly normal heart  sounds.  Good peripheral circulation. Respiratory: Normal respiratory effort.  No retractions. Lungs scattered wheezes Gastrointestinal: Soft and nontender. No distention. No abdominal bruits. No CVA tenderness. Musculoskeletal: No lower extremity tenderness minimal trace edema.   Neurologic:  Normal speech and language. No gross focal neurologic deficits are appreciated.  Skin:  Skin is warm, dry and intact. No rash noted. Psychiatric: Mood and affect are normal. Speech and behavior are normal.  ____________________________________________   LABS (all labs ordered are listed, but only abnormal results are displayed)  Labs Reviewed  BASIC METABOLIC PANEL - Abnormal; Notable for the following components:      Result Value   Glucose, Bld 135 (*)    All other components within normal limits  BRAIN NATRIURETIC PEPTIDE - Abnormal; Notable for the following components:   B Natriuretic Peptide 119.0 (*)    All other components within normal limits  FIBRIN DERIVATIVES D-DIMER ([facility] ONLY) - Abnormal; Notable for the following components:   Fibrin derivatives D-dimer ([facility]) 1,157.13 (*)    All other components within normal limits  CBC  TROPONIN I  TROPONIN I   ____________________________________________  EKG EKG read and interpreted by me shows normal sinus rhythm rate of 92 normal axis no acute ST-T wave changes  ____________________________________________  RADIOLOGY  ED MD interpretation: Chest x-ray shows no acute pathology d-dimer was very high so elected to do a CT which does not show any PE or focal infiltrate there is some haziness in both apices and posteriorly.  Radiology did not think this was significant.  I reviewed all the films.  Official radiology report(s): Dg Chest 2 View  Result Date: 07/14/2018 CLINICAL DATA:  Chest pain radiating to back which shortness-of-breath, nausea, dizziness and weakness as well as productive cough 5 days. EXAM: CHEST - 2 VIEW  COMPARISON:  10/28/2015 FINDINGS: Lungs are adequately inflated without airspace consolidation or effusion. Minimal biapical pleural thickening. Cardiomediastinal silhouette and remainder of the exam is unchanged. IMPRESSION: No active cardiopulmonary disease. Electronically Signed   By: Marin Olp M.D.   On: 07/14/2018 18:28   Ct Angio Chest Pe W And/or Wo Contrast  Result Date: 07/14/2018 CLINICAL DATA:  Chest pain radiating to the back. Cough. EXAM: CT ANGIOGRAPHY CHEST  WITH CONTRAST TECHNIQUE: Multidetector CT imaging of the chest was performed using the standard protocol during bolus administration of intravenous contrast. Multiplanar CT image reconstructions and MIPs were obtained to evaluate the vascular anatomy. CONTRAST:  42mL OMNIPAQUE IOHEXOL 350 MG/ML SOLN COMPARISON:  CT chest 11/04/2015 FINDINGS: Cardiovascular: --Pulmonary arteries: Contrast injection is sufficient to demonstrate satisfactory opacification of the pulmonary arteries to the segmental level. There is no pulmonary embolus. The main pulmonary artery is within normal limits for size. --Aorta: Limited opacification of the aorta due to bolus timing optimization for the pulmonary arteries. Conventional 3 vessel aortic branching pattern. The aortic course and caliber are normal. There is mild aortic atherosclerosis. --Heart: Normal size. No pericardial effusion. Mediastinum/Nodes: No mediastinal, hilar or axillary lymphadenopathy. The visualized thyroid and thoracic esophageal course are unremarkable. Lungs/Pleura: Upper lobe predominant emphysema. No pleural effusion or pneumothorax. No focal airspace consolidation. Upper Abdomen: Contrast bolus timing is not optimized for evaluation of the abdominal organs. Within this limitation, the visualized organs of the upper abdomen are normal. Musculoskeletal: No chest wall abnormality. No acute or significant osseous findings. Review of the MIP images confirms the above findings. IMPRESSION:  1. No pulmonary embolus. 2. No acute thoracic abnormality. Aortic Atherosclerosis (ICD10-I70.0) and Emphysema (ICD10-J43.9). Electronically Signed   By: Ulyses Jarred M.D.   On: 07/14/2018 22:53    ____________________________________________   PROCEDURES  Procedure(s) performed:   Procedures  Critical Care performed:  ____________________________________________   INITIAL IMPRESSION / ASSESSMENT AND PLAN / ED COURSE  Patient is coughing up phlegm and short of breath with some wheezing.  The wheezing got better with neb.  I will treat her with a different antibiotic and see if that helps.  I have reviewed the risk factors of Levaquin and we will try that.     ____________________________________________   FINAL CLINICAL IMPRESSION(S) / ED DIAGNOSES  Final diagnoses:  Nonspecific chest pain  COPD exacerbation [facility])     ED Discharge Orders         Ordered    levofloxacin (LEVAQUIN) 500 MG tablet  Daily     07/14/18 2327           Note:  This document was prepared using Dragon voice recognition software and may include unintentional dictation errors.    Nena Polio, MD 07/14/18 2683    Nena Polio, MD 07/15/18 [phone]

## 2018-07-14 NOTE — ED Notes (Signed)
Patient ambulatory to stat desk in no acute distress. Patient asking about wait time. Patient given update on wait time. Patient verbalizes understanding.

## 2018-07-14 NOTE — ED Notes (Signed)
ED Provider at bedside. 

## 2018-10-23 ENCOUNTER — Other Ambulatory Visit: Payer: Self-pay | Admitting: Gastroenterology

## 2018-10-23 DIAGNOSIS — R1319 Other dysphagia: Secondary | ICD-10-CM

## 2018-10-23 DIAGNOSIS — R1310 Dysphagia, unspecified: Secondary | ICD-10-CM

## 2018-10-27 ENCOUNTER — Other Ambulatory Visit: Payer: Self-pay

## 2018-10-27 ENCOUNTER — Ambulatory Visit
Admission: RE | Admit: 2018-10-27 | Discharge: 2018-10-27 | Disposition: A | Discharge status: Home / Self Care | Payer: Medicare Other | Source: Ambulatory Visit | Attending: Gastroenterology | Admitting: Gastroenterology

## 2018-10-27 DIAGNOSIS — R1310 Dysphagia, unspecified: Secondary | ICD-10-CM | POA: Insufficient documentation

## 2018-10-27 DIAGNOSIS — R1319 Other dysphagia: Secondary | ICD-10-CM

## 2019-07-11 ENCOUNTER — Other Ambulatory Visit: Payer: Self-pay | Admitting: Infectious Diseases

## 2019-07-11 DIAGNOSIS — R600 Localized edema: Secondary | ICD-10-CM

## 2019-07-13 ENCOUNTER — Ambulatory Visit: Payer: Medicare HMO

## 2019-07-18 ENCOUNTER — Ambulatory Visit
Admission: RE | Admit: 2019-07-18 | Discharge: 2019-07-18 | Disposition: A | Discharge status: Home / Self Care | Payer: Medicare HMO | Source: Ambulatory Visit | Attending: Infectious Diseases | Admitting: Infectious Diseases

## 2019-07-18 ENCOUNTER — Other Ambulatory Visit: Payer: Self-pay

## 2019-07-18 DIAGNOSIS — R600 Localized edema: Secondary | ICD-10-CM | POA: Diagnosis present

## 2019-07-25 ENCOUNTER — Other Ambulatory Visit: Payer: Self-pay | Admitting: Infectious Diseases

## 2019-07-25 DIAGNOSIS — Z1231 Encounter for screening mammogram for malignant neoplasm of breast: Secondary | ICD-10-CM

## 2019-07-31 ENCOUNTER — Ambulatory Visit
Admission: RE | Admit: 2019-07-31 | Discharge: 2019-07-31 | Disposition: A | Discharge status: Home / Self Care | Payer: Medicare HMO | Source: Ambulatory Visit | Attending: Infectious Diseases | Admitting: Infectious Diseases

## 2019-07-31 DIAGNOSIS — Z1231 Encounter for screening mammogram for malignant neoplasm of breast: Secondary | ICD-10-CM | POA: Diagnosis not present

## 2019-10-14 ENCOUNTER — Ambulatory Visit: Payer: Medicare Other | Attending: Internal Medicine

## 2019-10-14 ENCOUNTER — Ambulatory Visit: Payer: Medicare Other

## 2019-10-14 DIAGNOSIS — Z23 Encounter for immunization: Secondary | ICD-10-CM | POA: Insufficient documentation

## 2019-10-14 NOTE — Progress Notes (Signed)
   Covid-19 Vaccination Clinic  Name:  Kim Farmer    MRN: [id number] DOB: 09/16/1950  10/14/2019  Ms. Kim Farmer was observed post Covid-19 immunization for 15 minutes without incidence. She was provided with Vaccine Information Sheet and instruction to access the V-Safe system.   Ms. Kim Farmer was instructed to call 911 with any severe reactions post vaccine: Marland Kitchen Difficulty breathing  . Swelling of your face and throat  . A fast heartbeat  . A bad rash all over your body  . Dizziness and weakness    Immunizations Administered    Name Date Dose VIS Date Route   Pfizer COVID-19 Vaccine 10/14/2019 12:14 PM 0.3 mL 07/27/2019 Intramuscular   Manufacturer: [place]   Lot: [id number]   [place]: [id number]

## 2019-11-06 ENCOUNTER — Ambulatory Visit: Payer: Medicare Other | Attending: Internal Medicine

## 2019-11-06 DIAGNOSIS — Z23 Encounter for immunization: Secondary | ICD-10-CM

## 2019-11-06 NOTE — Progress Notes (Signed)
   Covid-19 Vaccination Clinic  Name:  Kim Farmer    MRN: [id number] DOB: Mar 12, 1951  11/06/2019  [name] was observed post Covid-19 immunization for 15 minutes without incident. She was provided with Vaccine Information Sheet and instruction to access the V-Safe system.   [name] was instructed to call 911 with any severe reactions post vaccine: Marland Kitchen Difficulty breathing  . Swelling of face and throat  . A fast heartbeat  . A bad rash all over body  . Dizziness and weakness   Immunizations Administered    Name Date Dose VIS Date Route   Pfizer COVID-19 Vaccine 11/06/2019  1:17 PM 0.3 mL 07/27/2019 Intramuscular   Manufacturer: [place]   Lot: [id number]   [place]: [id number]

## 2020-06-11 ENCOUNTER — Encounter: Payer: Self-pay | Admitting: Emergency Medicine

## 2020-06-11 ENCOUNTER — Other Ambulatory Visit: Payer: Self-pay

## 2020-06-11 ENCOUNTER — Emergency Department
Admission: EM | Admit: 2020-06-11 | Discharge: 2020-06-11 | Disposition: A | Discharge status: Home / Self Care | Payer: Medicare Other | Attending: Emergency Medicine | Admitting: Emergency Medicine

## 2020-06-11 DIAGNOSIS — J449 Chronic obstructive pulmonary disease, unspecified: Secondary | ICD-10-CM | POA: Insufficient documentation

## 2020-06-11 DIAGNOSIS — Z79899 Other long term (current) drug therapy: Secondary | ICD-10-CM | POA: Diagnosis not present

## 2020-06-11 DIAGNOSIS — S91331A Puncture wound without foreign body, right foot, initial encounter: Secondary | ICD-10-CM

## 2020-06-11 DIAGNOSIS — X58XXXA Exposure to other specified factors, initial encounter: Secondary | ICD-10-CM | POA: Diagnosis not present

## 2020-06-11 DIAGNOSIS — F17210 Nicotine dependence, cigarettes, uncomplicated: Secondary | ICD-10-CM | POA: Insufficient documentation

## 2020-06-11 DIAGNOSIS — Z7982 Long term (current) use of aspirin: Secondary | ICD-10-CM | POA: Diagnosis not present

## 2020-06-11 DIAGNOSIS — S99921A Unspecified injury of right foot, initial encounter: Secondary | ICD-10-CM | POA: Diagnosis present

## 2020-06-11 NOTE — ED Triage Notes (Signed)
Pt comes into the ED via [facility] from home c/o laceration to the left ankle.  Pt was shaving and is concerned she may have hit a varicose vein.  VSS with EMS.  Denies any dizziness, shortness of breath, etc. Bleeding controlled at this time.

## 2020-06-11 NOTE — Discharge Instructions (Addendum)
Your bleeding was controlled with Surgicel and pressure dressing.  Follow discharge care instructions.

## 2020-06-11 NOTE — ED Provider Notes (Signed)
[facility] Emergency Department Provider Note   ____________________________________________   First MD Initiated Contact with Patient 06/11/20 1333     (approximate)  I have reviewed the triage vital signs and the nursing notes.   HISTORY  Chief Complaint Laceration    HPI Kim Farmer is a 69 y.o. female patient arrived via EMS secondary to laceration/puncture wound to the dorsal aspect of right ankle.  Patient states she was shaving believes she caught a varicose vein.  Patient neighbor applied baking soda paste and a pressure dressing to the wound.  Patient arrived to ED with bleeding controlled.  Denies loss sensation or loss of function.  States no pain at this time.         Past Medical History:  Diagnosis Date  . Anxiety   . Arthritis    back, hands, feet  . Asthma   . Barrett esophagus   . Bulging lumbar disc   . Chronic pain   . COPD (chronic obstructive pulmonary disease) ([place])   . Degenerative disc disease, cervical    limited left turn  . Depression   . GERD (gastroesophageal reflux disease)   . Hepatitis    age 56 or 6. hospitalized.   . Hiatal hernia   . Hyperlipidemia   . Osteopenia   . Presence of dental bridge    permanent upper  . Sciatica   . Sleep apnea    CPAP    Patient Active Problem List   Diagnosis Date Noted  . Skin ulcer of face, limited to breakdown of skin ([place]) 05/04/2017  . Venous insufficiency (chronic) (peripheral) 03/02/2017  . Frequent falls 02/27/2017  . Imbalance 02/27/2017  . Well-controlled hypertension 02/14/2017  . Bilateral leg edema 12/13/2016  . Itching 11/14/2016  . Leg ulcer, right, limited to breakdown of skin ([place]) 11/10/2016  . Severe uncontrolled hypertension 11/02/2016  . Actinic keratoses 09/29/2016  . Neurotic excoriations 09/10/2016  . Elevated blood pressure reading 07/31/2016  . Pure hypercholesterolemia 07/31/2016  . Coronary artery calcification seen on CT scan  11/04/2015  . History of esophageal stricture 10/28/2015  . Lung nodule, solitary 10/28/2015  . Allergic rhinitis, seasonal 10/10/2015  . Other specified diseases of esophagus   . Problems with swallowing and mastication   . Centrilobular emphysema ([place]) 06/16/2015  . Cervical radiculitis 06/16/2015  . DDD (degenerative disc disease), lumbar 06/16/2015  . Osteoarthritis 06/16/2015  . Chronic pain 06/16/2015  . Bunion 06/16/2015  . GAD (generalized anxiety disorder) 06/16/2015  . Lumbar radiculitis 06/16/2015  . Dyslipidemia 06/16/2015  . Hiatal hernia 06/16/2015  . GERD without esophagitis 06/16/2015  . Keratitis sicca, both eyes ([place]) 06/16/2015  . 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 History of skin cancer 06/16/2015  . OSA on CPAP 06/16/2015  . Tobacco use disorder, continuous 12/17/2014  . Disturbance in sleep behavior 12/17/2014  . Cigarette nicotine dependence without complication 75/17/0017  . Hypokalemia 01/10/2014  . Intractable vomiting 01/09/2014    Past Surgical History:  Procedure Laterality Date  . ABDOMINAL HYSTERECTOMY    . AUGMENTATION MAMMAPLASTY Bilateral 2001  . BREAST SURGERY     saline implants  . CARDIAC CATHETERIZATION Left 06/10/2016   Procedure: Left Heart Cath and Coronary Angiography;  Surgeon: Yolonda Kida, MD;  Location: [place] [facility];  Service: Cardiovascular;  Laterality: Left;  . ESOPHAGEAL DILATION    . ESOPHAGOGASTRODUODENOSCOPY (EGD) WITH PROPOFOL N/A 08/07/2015   Procedure: ESOPHAGOGASTRODUODENOSCOPY (EGD) WITH PROPOFOL with balloon dilation;  Surgeon: Evangeline Gula  Allen Norris, MD;  Location: [place];  Service: Endoscopy;  Laterality: N/A;  . EYE SURGERY    . HAMMER TOE SURGERY Right     Prior to Admission medications   Medication Sig Start Date End Date Taking? Authorizing Provider  albuterol (PROAIR HFA) 108 (90 BASE) MCG/ACT inhaler Inhale 1 puff into the lungs every 6 (six) hours as needed.  05/23/15   [provider]  ALPRAZolam Duanne Moron) 1 MG tablet Take 1 mg by mouth 3 (three) times daily.    [provider]  Armodafinil 250 MG tablet TK 1 T PO QD 02/02/16   [provider]  aspirin EC 81 MG tablet Take 1 tablet (81 mg total) by mouth daily. 11/04/15   Steele Sizer, MD  atorvastatin (LIPITOR) 40 MG tablet TAKE 1 TABLET(40 MG) BY MOUTH DAILY 02/27/16   Steele Sizer, MD  Calcium-Vitamin D 600-200 MG-UNIT tablet Take 1 tablet by mouth daily.    [provider]  citalopram (CELEXA) 20 MG tablet Take 1 tablet (20 mg total) by mouth daily. 02/27/16   Steele Sizer, MD  fluticasone (FLONASE) 50 MCG/ACT nasal spray SHAKE LIQUID AND USE 2 SPRAYS IN EACH NOSTRIL DAILY 06/24/16   Sowles, Drue Stager, MD  Omega-3 Fatty Acids (FISH OIL) 1000 MG CAPS Take 1 capsule by mouth daily. Reported on 08/07/2015    [provider]  omeprazole (PRILOSEC) 20 MG capsule Take by mouth daily.  05/26/15   [provider]  oxycodone (ROXICODONE) 30 MG immediate release tablet Take 15 mg by mouth every 4 (four) hours as needed for pain.    [provider]  Oxycodone HCl 20 MG TABS Take 1 tablet by mouth every 4 (four) hours as needed. 04/18/15   [provider]  RESTASIS 0.05 % ophthalmic emulsion Place 1 drop into both eyes 2 (two) times daily.  07/17/15   [provider]  triamcinolone ointment (KENALOG) 0.1 % Reported on 11/04/2015 07/01/15   [provider]  umeclidinium-vilanterol (ANORO ELLIPTA) 62.5-25 MCG/INH AEPB Inhale 1 puff into the lungs daily. 02/27/16   Steele Sizer, MD  valACYclovir (VALTREX) 1000 MG tablet Take 1,000 mg by mouth daily as needed.  03/13/15   [provider]  VOLTAREN 1 % GEL Apply 1 application topically daily. Reported on 11/04/2015 05/09/15   [provider]    Allergies Penicillins and Sulfa antibiotics  Family History  Problem Relation Age of Onset  . Diabetes Mother   . Hypertension Mother     . Diabetes Father   . Hypertension Father   . COPD Sister   . Diabetes Sister   . Emphysema Sister     Social History Social History   Tobacco Use  . Smoking status: Current Every Day Smoker    Packs/day: 0.50    Years: 40.00    Pack years: 20.00    Types: Cigarettes    Start date: 06/16/1975  . Smokeless tobacco: Never Used  Substance Use Topics  . Alcohol use: Yes    Alcohol/week: 0.0 standard drinks    Comment: 2 beers today - daughter states the patient drinks regularly and heavily  . Drug use: No    Review of Systems Constitutional: No fever/chills Eyes: No visual changes. ENT: No sore throat. Cardiovascular: Denies chest pain. Respiratory: Denies shortness of breath. Gastrointestinal: No abdominal pain.  No nausea, no vomiting.  No diarrhea.  No constipation. Genitourinary: Negative for dysuria. Musculoskeletal: Negative for back pain. Skin: Negative for rash.  Laceration  right ankle. Neurological: Negative for headaches, focal weakness or numbness. Psychiatric:  Anxiety. Endocrine:  Hyperlipidemia. Allergic/Immunilogical: Penicillin and sulfa antibiotics. ____________________________________________   PHYSICAL EXAM:  VITAL SIGNS: ED Triage Vitals [06/11/20 1311]  Enc Vitals Group     BP (!) 157/96     Pulse Rate 68     Resp 18     Temp 98.4 F (36.9 C)     Temp Source Oral     SpO2 100 %     Weight 150 lb (68 kg)     Height 5\' 3"  (1.6 m)     Head Circumference      Peak Flow      Pain Score 0     Pain Loc      Pain Edu?      Excl. in [place]?     Constitutional: Alert and oriented. Well appearing and in no acute distress. Cardiovascular: Normal rate, regular rhythm. Grossly normal heart sounds.  Good peripheral circulation.  Elevated blood pressure. Respiratory: Normal respiratory effort.  No retractions. Lungs CTAB. Genitourinary: Deferred Musculoskeletal: No lower extremity tenderness nor edema.  No joint effusions. Neurologic:  Normal speech  and language. No gross focal neurologic deficits are appreciated. No gait instability. Skin: Small puncture wound anterior right ankle.   Psychiatric: Mood and affect are normal. Speech and behavior are normal.  ____________________________________________   LABS (all labs ordered are listed, but only abnormal results are displayed)  Labs Reviewed - No data to display ____________________________________________  EKG   ____________________________________________  RADIOLOGY I, Sable Feil, personally viewed and evaluated these images (plain radiographs) as part of my medical decision making, as well as reviewing the written report by the radiologist.  ED MD interpretation:    Official radiology report(s): No results found.  ____________________________________________   PROCEDURES  Procedure(s) performed (including Critical Care):  Procedures   ____________________________________________   INITIAL IMPRESSION / ASSESSMENT AND PLAN / ED COURSE  As part of my medical decision making, I reviewed the following data within the [place]         Patient presents for laceration/puncture wound to the dorsal aspect of the right ankle.  Bleeding was controlled prior to arrival.  Area was cleaned Surgicel applied with pressure dressing.  Patient given discharge care instructions and advised return back if condition worsens.      ____________________________________________   FINAL CLINICAL IMPRESSION(S) / ED DIAGNOSES  Final diagnoses:  Puncture wound of right foot, initial encounter     ED Discharge Orders    None      *Please note:  Cena Bruhn was evaluated in Emergency Department on 06/11/2020 for the symptoms described in the history of present illness. She was evaluated in the context of the global COVID-19 pandemic, which necessitated consideration that the patient might be at risk for infection with the SARS-CoV-2 virus that causes  COVID-19. Institutional protocols and algorithms that pertain to the evaluation of patients at risk for COVID-19 are in a state of rapid change based on information released by regulatory bodies including the CDC and federal and state organizations. These policies and algorithms were followed during the patient's care in the ED.  Some ED evaluations and interventions may be delayed as a result of limited staffing during and the pandemic.*   Note:  This document was prepared using Dragon voice recognition software and may include unintentional dictation errors.    Sable Feil, PA-C 06/11/20 1435    Carrie Mew, MD 06/16/20  2305  

## 2020-06-24 ENCOUNTER — Other Ambulatory Visit: Payer: Self-pay | Admitting: Infectious Diseases

## 2020-06-24 DIAGNOSIS — Z1231 Encounter for screening mammogram for malignant neoplasm of breast: Secondary | ICD-10-CM

## 2020-08-21 ENCOUNTER — Ambulatory Visit
Admission: RE | Admit: 2020-08-21 | Discharge: 2020-08-21 | Disposition: A | Discharge status: Home / Self Care | Payer: Medicare Other | Source: Ambulatory Visit | Attending: Infectious Diseases | Admitting: Infectious Diseases

## 2020-08-21 ENCOUNTER — Other Ambulatory Visit: Payer: Self-pay

## 2020-08-21 DIAGNOSIS — Z1231 Encounter for screening mammogram for malignant neoplasm of breast: Secondary | ICD-10-CM | POA: Insufficient documentation

## 2020-11-25 ENCOUNTER — Emergency Department: Payer: Medicare Other

## 2020-11-25 ENCOUNTER — Other Ambulatory Visit: Payer: Self-pay

## 2020-11-25 ENCOUNTER — Emergency Department
Admission: EM | Admit: 2020-11-25 | Discharge: 2020-11-25 | Disposition: A | Discharge status: Home / Self Care | Payer: Medicare Other | Attending: Emergency Medicine | Admitting: Emergency Medicine

## 2020-11-25 DIAGNOSIS — J45909 Unspecified asthma, uncomplicated: Secondary | ICD-10-CM | POA: Diagnosis not present

## 2020-11-25 DIAGNOSIS — Z85828 Personal history of other malignant neoplasm of skin: Secondary | ICD-10-CM | POA: Diagnosis not present

## 2020-11-25 DIAGNOSIS — I10 Essential (primary) hypertension: Secondary | ICD-10-CM | POA: Insufficient documentation

## 2020-11-25 DIAGNOSIS — F17210 Nicotine dependence, cigarettes, uncomplicated: Secondary | ICD-10-CM | POA: Insufficient documentation

## 2020-11-25 DIAGNOSIS — Z7982 Long term (current) use of aspirin: Secondary | ICD-10-CM | POA: Diagnosis not present

## 2020-11-25 DIAGNOSIS — J441 Chronic obstructive pulmonary disease with (acute) exacerbation: Secondary | ICD-10-CM | POA: Diagnosis not present

## 2020-11-25 DIAGNOSIS — R0602 Shortness of breath: Secondary | ICD-10-CM | POA: Diagnosis present

## 2020-11-25 DIAGNOSIS — Z79899 Other long term (current) drug therapy: Secondary | ICD-10-CM | POA: Diagnosis not present

## 2020-11-25 LAB — COMPREHENSIVE METABOLIC PANEL
ALT: 46 U/L — ABNORMAL HIGH (ref 0–44)
AST: 42 U/L — ABNORMAL HIGH (ref 15–41)
Albumin: 3.5 g/dL (ref 3.5–5.0)
Alkaline Phosphatase: 99 U/L (ref 38–126)
Anion gap: 8 (ref 5–15)
BUN: 11 mg/dL (ref 8–23)
CO2: 24 mmol/L (ref 22–32)
Calcium: 8.7 mg/dL — ABNORMAL LOW (ref 8.9–10.3)
Chloride: 104 mmol/L (ref 98–111)
Creatinine, Ser: 0.52 mg/dL (ref 0.44–1.00)
GFR, Estimated: >60 mL/min (ref >60)
Glucose, Bld: 133 mg/dL — ABNORMAL HIGH (ref 70–99)
Potassium: 3.7 mmol/L (ref 3.5–5.1)
Sodium: 136 mmol/L (ref 135–145)
Total Bilirubin: 1 mg/dL (ref 0.3–1.2)
Total Protein: 6.7 g/dL (ref 6.5–8.1)

## 2020-11-25 LAB — CBC WITH DIFFERENTIAL/PLATELET
Abs Immature Granulocytes: 0.06 10*3/uL (ref 0.00–0.07)
Basophils Absolute: 0 10*3/uL (ref 0.0–0.1)
Basophils Relative: 0 %
Eosinophils Absolute: 0.1 10*3/uL (ref 0.0–0.5)
Eosinophils Relative: 1 %
HCT: 31.4 % — ABNORMAL LOW (ref 36.0–46.0)
Hemoglobin: 10.6 g/dL — ABNORMAL LOW (ref 12.0–15.0)
Immature Granulocytes: 1 %
Lymphocytes Relative: 11 %
Lymphs Abs: 1.1 10*3/uL (ref 0.7–4.0)
MCH: 30.5 pg (ref 26.0–34.0)
MCHC: 33.8 g/dL (ref 30.0–36.0)
MCV: 90.5 fL (ref 80.0–100.0)
Monocytes Absolute: 0.5 10*3/uL (ref 0.1–1.0)
Monocytes Relative: 5 %
Neutro Abs: 8.2 10*3/uL — ABNORMAL HIGH (ref 1.7–7.7)
Neutrophils Relative %: 82 %
Platelets: 221 10*3/uL (ref 150–400)
RBC: 3.47 MIL/uL — ABNORMAL LOW (ref 3.87–5.11)
RDW: 12.2 % (ref 11.5–15.5)
WBC: 10.1 10*3/uL (ref 4.0–10.5)
nRBC: 0 % (ref 0.0–0.2)

## 2020-11-25 LAB — LACTIC ACID, PLASMA: Lactic Acid, Venous: 0.8 mmol/L (ref 0.5–1.9)

## 2020-11-25 MED ORDER — ALBUTEROL SULFATE (2.5 MG/3ML) 0.083% IN NEBU
5.0000 mg | INHALATION_SOLUTION | Freq: Once | RESPIRATORY_TRACT | Status: AC
  Administered 2020-11-25: 5 mg via RESPIRATORY_TRACT
  Filled 2020-11-25: qty 6

## 2020-11-25 MED ORDER — ACETAMINOPHEN 500 MG PO TABS
1000.0000 mg | ORAL_TABLET | Freq: Once | ORAL | Status: AC
  Administered 2020-11-25: 1000 mg via ORAL
  Filled 2020-11-25: qty 2

## 2020-11-25 MED ORDER — METHYLPREDNISOLONE SODIUM SUCC 125 MG IJ SOLR
80.0000 mg | Freq: Once | INTRAMUSCULAR | Status: AC
  Administered 2020-11-25: 80 mg via INTRAVENOUS
  Filled 2020-11-25: qty 2

## 2020-11-25 MED ORDER — IPRATROPIUM-ALBUTEROL 0.5-2.5 (3) MG/3ML IN SOLN
3.0000 mL | Freq: Once | RESPIRATORY_TRACT | Status: AC
  Administered 2020-11-25: 3 mL via RESPIRATORY_TRACT
  Filled 2020-11-25: qty 3

## 2020-11-25 MED ORDER — PREDNISONE 20 MG PO TABS: 40.0000 mg | ORAL_TABLET | Freq: Every day | ORAL | 0 refills | Status: AC

## 2020-11-25 MED ORDER — PHENYLEPHRINE-ACETAMINOPHEN 10-650 MG PO PACK: 1.0000 | PACK | Freq: Four times a day (QID) | ORAL | 0 refills | Status: DC | PRN

## 2020-11-25 MED ORDER — SODIUM CHLORIDE 0.9 % IV BOLUS (SEPSIS)
1000.0000 mL | Freq: Once | INTRAVENOUS | Status: AC
  Administered 2020-11-25: 1000 mL via INTRAVENOUS

## 2020-11-25 NOTE — ED Triage Notes (Signed)
Pt BIBA from home complaining of shortness of breath since Friday. Pt reports seeing PCP and was given antibiotic and cough medicine, but symptoms have progressively worsened. Pt reports she has had recurrent fevers that she has been taken tylenol for. EMS reports that pt's cough has turned to pink frothy sputum.   EMS vitals: 180/72, 75 hr, 100%   Pt also states she feels like she can't get a deep breath in and feels like "one of her lungs is collapsed."  Pt presents c/o of congestion and unable to breathe out of her nose. Pt able to speak in full sentences at this time and O2 sats >94%.   Pt also states she has taken home covid test that was negative, and PCP tested her as well, and results have not come back yet.

## 2020-11-25 NOTE — ED Provider Notes (Signed)
[facility] Emergency Department Provider Note  ____________________________________________  Time seen: Approximately 7:28 AM  I have reviewed the triage vital signs and the nursing notes.   HISTORY  Chief Complaint Shortness of Breath    HPI Alexiss Iturralde is a 70 y.o. female with a history of COPD, depression, GERD, anxiety who comes ED complaining of shortness of breath cough and nasal congestion for the past 4 days.  Gradual onset, no significant chest pain.  Not exertional.  Shortness of breath is worse with walking, no real alleviating factors.  Cough is minimally productive.  Associated with fever at home.  Patient saw primary care yesterday, had Covid and flu PCR which were negative.  Was started on doxycycline.      Past Medical History:  Diagnosis Date  . Anxiety   . Arthritis    back, hands, feet  . Asthma   . Barrett esophagus   . Bulging lumbar disc   . Chronic pain   . COPD (chronic obstructive pulmonary disease) ([place])   . Degenerative disc disease, cervical    limited left turn  . Depression   . GERD (gastroesophageal reflux disease)   . Hepatitis    age 33 or 64. hospitalized.   . Hiatal hernia   . Hyperlipidemia   . Osteopenia   . Presence of dental bridge    permanent upper  . Sciatica   . Sleep apnea    CPAP     Patient Active Problem List   Diagnosis Date Noted  . Skin ulcer of face, limited to breakdown of skin ([place]) 05/04/2017  . Venous insufficiency (chronic) (peripheral) 03/02/2017  . Frequent falls 02/27/2017  . Imbalance 02/27/2017  . Well-controlled hypertension 02/14/2017  . Bilateral leg edema 12/13/2016  . Itching 11/14/2016  . Leg ulcer, right, limited to breakdown of skin ([place]) 11/10/2016  . Severe uncontrolled hypertension 11/02/2016  . Actinic keratoses 09/29/2016  . Neurotic excoriations 09/10/2016  . Elevated blood pressure reading 07/31/2016  . Pure hypercholesterolemia 07/31/2016  . Coronary  artery calcification seen on CT scan 11/04/2015  . History of esophageal stricture 10/28/2015  . Lung nodule, solitary 10/28/2015  . Allergic rhinitis, seasonal 10/10/2015  . Other specified diseases of esophagus   . Problems with swallowing and mastication   . Centrilobular emphysema ([place]) 06/16/2015  . Cervical radiculitis 06/16/2015  . DDD (degenerative disc disease), lumbar 06/16/2015  . Osteoarthritis 06/16/2015  . Chronic pain 06/16/2015  . Bunion 06/16/2015  . GAD (generalized anxiety disorder) 06/16/2015  . Lumbar radiculitis 06/16/2015  . Dyslipidemia 06/16/2015  . Hiatal hernia 06/16/2015  . GERD without esophagitis 06/16/2015  . Keratitis sicca, both eyes ([place]) 06/16/2015  . 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 History of skin cancer 06/16/2015  . OSA on CPAP 06/16/2015  . Tobacco use disorder, continuous 12/17/2014  . Disturbance in sleep behavior 12/17/2014  . Cigarette nicotine dependence without complication 17/40/8144  . Hypokalemia 01/10/2014  . Intractable vomiting 01/09/2014     Past Surgical History:  Procedure Laterality Date  . ABDOMINAL HYSTERECTOMY    . AUGMENTATION MAMMAPLASTY Bilateral 2001  . BREAST SURGERY     saline implants  . CARDIAC CATHETERIZATION Left 06/10/2016   Procedure: Left Heart Cath and Coronary Angiography;  Surgeon: Yolonda Kida, MD;  Location: [place] [facility];  Service: Cardiovascular;  Laterality: Left;  . ESOPHAGEAL DILATION    . ESOPHAGOGASTRODUODENOSCOPY (EGD) WITH PROPOFOL N/A 08/07/2015   Procedure: ESOPHAGOGASTRODUODENOSCOPY (EGD) WITH PROPOFOL with balloon dilation;  Surgeon: Lucilla Lame, MD;  Location: [facility];  Service: Endoscopy;  Laterality: N/A;  . EYE SURGERY    . HAMMER TOE SURGERY Right      Prior to Admission medications   Medication Sig Start Date End Date Taking? Authorizing Provider  Phenylephrine-Acetaminophen 10-650 MG PACK Take 1 tablet by mouth every 6 (six) hours as needed.  11/25/20  Yes Carrie Mew, MD  predniSONE (DELTASONE) 20 MG tablet Take 2 tablets (40 mg total) by mouth daily with breakfast for 4 days. 11/25/20 11/29/20 Yes Carrie Mew, MD  albuterol [facility] HFA) 108 (90 BASE) MCG/ACT inhaler Inhale 1 puff into the lungs every 6 (six) hours as needed.  05/23/15   [provider]  ALPRAZolam Duanne Moron) 1 MG tablet Take 1 mg by mouth 3 (three) times daily.    [provider]  Armodafinil 250 MG tablet TK 1 T PO QD 02/02/16   [provider]  aspirin EC 81 MG tablet Take 1 tablet (81 mg total) by mouth daily. 11/04/15   Steele Sizer, MD  atorvastatin (LIPITOR) 40 MG tablet TAKE 1 TABLET(40 MG) BY MOUTH DAILY 02/27/16   Steele Sizer, MD  Calcium-Vitamin D 600-200 MG-UNIT tablet Take 1 tablet by mouth daily.    [provider]  citalopram (CELEXA) 20 MG tablet Take 1 tablet (20 mg total) by mouth daily. 02/27/16   Steele Sizer, MD  fluticasone (FLONASE) 50 MCG/ACT nasal spray SHAKE LIQUID AND USE 2 SPRAYS IN EACH NOSTRIL DAILY 06/24/16   Sowles, Drue Stager, MD  Omega-3 Fatty Acids (FISH OIL) 1000 MG CAPS Take 1 capsule by mouth daily. Reported on 08/07/2015    [provider]  omeprazole (PRILOSEC) 20 MG capsule Take by mouth daily.  05/26/15   [provider]  oxycodone (ROXICODONE) 30 MG immediate release tablet Take 15 mg by mouth every 4 (four) hours as needed for pain.    [provider]  Oxycodone HCl 20 MG TABS Take 1 tablet by mouth every 4 (four) hours as needed. 04/18/15   [provider]  RESTASIS 0.05 % ophthalmic emulsion Place 1 drop into both eyes 2 (two) times daily.  07/17/15   [provider]  triamcinolone ointment (KENALOG) 0.1 % Reported on 11/04/2015 07/01/15   [provider]  umeclidinium-vilanterol (ANORO ELLIPTA) 62.5-25 MCG/INH AEPB Inhale 1 puff into the lungs daily. 02/27/16   Steele Sizer, MD  valACYclovir (VALTREX) 1000 MG tablet Take 1,000 mg  by mouth daily as needed.  03/13/15   [provider]  VOLTAREN 1 % GEL Apply 1 application topically daily. Reported on 11/04/2015 05/09/15   [provider]     Allergies Penicillins and Sulfa antibiotics   Family History  Problem Relation Age of Onset  . Diabetes Mother   . Hypertension Mother   . Diabetes Father   . Hypertension Father   . COPD Sister   . Diabetes Sister   . Emphysema Sister     Social History Social History   Tobacco Use  . Smoking status: Current Every Day Smoker    Packs/day: 0.50    Years: 40.00    Pack years: 20.00    Types: Cigarettes    Start date: 06/16/1975  . Smokeless tobacco: Never Used  Substance Use Topics  . Alcohol use: Yes    Alcohol/week: 0.0 standard drinks    Comment: 2 beers today - daughter states the patient drinks regularly and heavily  . Drug use: No    Review  of Systems  Constitutional:   No fever or chills.  ENT:   No sore throat. No rhinorrhea. Cardiovascular:   No chest pain or syncope. Respiratory:   Positive shortness of breath and cough. Gastrointestinal:   Negative for abdominal pain, vomiting and diarrhea.  Musculoskeletal:   Negative for focal pain or swelling All other systems reviewed and are negative except as documented above in ROS and HPI.  ____________________________________________   PHYSICAL EXAM:  VITAL SIGNS: ED Triage Vitals  Enc Vitals Group     BP 11/25/20 0653 (!) 162/72     Pulse Rate 11/25/20 0653 67     Resp 11/25/20 0653 18     Temp 11/25/20 0653 100.3 F (37.9 C)     Temp Source 11/25/20 0653 Oral     SpO2 11/25/20 0653 94 %     Weight 11/25/20 0655 148 lb (67.1 kg)     Height 11/25/20 0655 5\' 3"  (1.6 m)     Head Circumference --      Peak Flow --      Pain Score 11/25/20 0654 9     Pain Loc --      Pain Edu? --      Excl. in [place]? --     Vital signs reviewed, nursing assessments reviewed.   Constitutional:   Alert and oriented. Non-toxic  appearance. Eyes:   Conjunctivae are normal. EOMI. PERRL. ENT      Head:   Normocephalic and atraumatic.      Nose:   Wearing a mask.      Mouth/Throat:   Wearing a mask.      Neck:   No meningismus. Full ROM. Hematological/Lymphatic/Immunilogical:   No cervical lymphadenopathy. Cardiovascular:   RRR. Symmetric bilateral radial and DP pulses.  No murmurs. Cap refill less than 2 seconds. Respiratory:   Normal respiratory effort without tachypnea/retractions.  There is expiratory wheezing and mildly prolonged expiratory phase.  No focal crackles Gastrointestinal:   Soft and nontender. Non distended. There is no CVA tenderness.  No rebound, rigidity, or guarding.  Musculoskeletal:   Normal range of motion in all extremities. No joint effusions.  No lower extremity tenderness.  No edema. Neurologic:   Normal speech and language.  Motor grossly intact. No acute focal neurologic deficits are appreciated.  Skin:    Skin is warm, dry and intact. No rash noted.  No petechiae, purpura, or bullae.  ____________________________________________    LABS (pertinent positives/negatives) (all labs ordered are listed, but only abnormal results are displayed) Labs Reviewed  COMPREHENSIVE METABOLIC PANEL - Abnormal; Notable for the following components:      Result Value   Glucose, Bld 133 (*)    Calcium 8.7 (*)    AST 42 (*)    ALT 46 (*)    All other components within normal limits  CBC WITH DIFFERENTIAL/PLATELET - Abnormal; Notable for the following components:   RBC 3.47 (*)    Hemoglobin 10.6 (*)    HCT 31.4 (*)    Neutro Abs 8.2 (*)    All other components within normal limits  CULTURE, BLOOD (SINGLE)  LACTIC ACID, PLASMA   ____________________________________________   EKG  Interpreted by me Sinus rhythm rate of 62, normal axis and intervals.  Normal QRS ST segments and T waves  ____________________________________________    RADIOLOGY  DG Chest Port 1 View  Result Date:  11/25/2020 CLINICAL DATA:  69 year old female with cough and shortness of breath. Recently started on antibiotics and cough medicine.  EXAM: PORTABLE CHEST 1 VIEW COMPARISON:  Chest CT 07/14/2018 and earlier. FINDINGS: Portable AP upright view at 0726 hours. Chronic pulmonary hyperinflation. Mediastinal contours remain normal. Chronic but increased interstitial opacity with both lungs, coarse bilateral basilar predominant interstitial markings. No pneumothorax, pleural effusion or consolidation. Visualized tracheal air column is within normal limits. No acute osseous abnormality identified. IMPRESSION: Coarse, acute on chronic bilateral pulmonary interstitial opacity increased since 2019. Differential considerations include acute viral/atypical respiratory infection, progressed chronic interstitial lung disease, less likely pulmonary interstitial edema. No pleural effusion. Electronically Signed   By: Genevie Ann M.D.   On: 11/25/2020 07:48    ____________________________________________   PROCEDURES Procedures  ____________________________________________  DIFFERENTIAL DIAGNOSIS   Pneumonia, COPD exacerbation, pleural effusion, pneumothorax  CLINICAL IMPRESSION / ASSESSMENT AND PLAN / ED COURSE  Medications ordered in the ED: Medications  sodium chloride 0.9 % bolus 1,000 mL (1,000 mLs Intravenous New Bag/Given 11/25/20 0816)  acetaminophen (TYLENOL) tablet 1,000 mg (1,000 mg Oral Given 11/25/20 0807)  methylPREDNISolone sodium succinate (SOLU-MEDROL) 125 mg/2 mL injection 80 mg (80 mg Intravenous Given 11/25/20 0809)  ipratropium-albuterol (DUONEB) 0.5-2.5 (3) MG/3ML nebulizer solution 3 mL (3 mLs Nebulization Given 11/25/20 0807)  albuterol (PROVENTIL) (2.5 MG/3ML) 0.083% nebulizer solution 5 mg (5 mg Nebulization Given 11/25/20 4782)    Pertinent labs & imaging results that were available during my care of the patient were reviewed by me and considered in my medical decision making (see chart for  details).  Tenishia Ekman was evaluated in Emergency Department on 11/25/2020 for the symptoms described in the history of present illness. She was evaluated in the context of the global COVID-19 pandemic, which necessitated consideration that the patient might be at risk for infection with the SARS-CoV-2 virus that causes COVID-19. Institutional protocols and algorithms that pertain to the evaluation of patients at risk for COVID-19 are in a state of rapid change based on information released by regulatory bodies including the CDC and federal and state organizations. These policies and algorithms were followed during the patient's care in the ED.   Patient presents with shortness of breath and congestion, consistent with URI with cough and COPD exacerbation most likely.  Doubt sepsis.  Doubt ACS PE dissection AAA pericardial effusion.  Will obtain labs chest x-ray, give Tylenol, steroids, bronchodilators.  Clinical Course as of 11/25/20 0911  Tue Nov 25, 2020  0903 Still feels fatigued, shortness of breath improved.  On repeat exam, expiratory phase is normal, wheezing resolved.  Oxygen saturation 100% on room air.  Stable for discharge, continue doxycycline as prescribed by PCP, will add on a course of steroids. [PS]    Clinical Course User Index [PS] Carrie Mew, MD     ____________________________________________   FINAL CLINICAL IMPRESSION(S) / ED DIAGNOSES    Final diagnoses:  COPD exacerbation [facility])     ED Discharge Orders         Ordered    predniSONE (DELTASONE) 20 MG tablet  Daily with breakfast        11/25/20 0911    Phenylephrine-Acetaminophen 10-650 MG PACK  Every 6 hours PRN        11/25/20 0911          Portions of this note were generated with dragon dictation software. Dictation errors may occur despite best attempts at proofreading.   Carrie Mew, MD 11/25/20 [phone]

## 2020-11-25 NOTE — ED Notes (Signed)
IV catheter removed intact without complication.  D/C instructions given.  RX and follow up discussed.  All questions addressed.  Understanding verbalized.  Pt left ER via w/c.

## 2020-11-30 LAB — CULTURE, BLOOD (SINGLE)
Culture: NO GROWTH
Special Requests: ADEQUATE

## 2021-08-14 ENCOUNTER — Other Ambulatory Visit: Payer: Self-pay | Admitting: Infectious Diseases

## 2021-08-14 DIAGNOSIS — Z1231 Encounter for screening mammogram for malignant neoplasm of breast: Secondary | ICD-10-CM

## 2021-08-24 ENCOUNTER — Other Ambulatory Visit: Payer: Self-pay

## 2021-08-24 ENCOUNTER — Ambulatory Visit
Admission: RE | Admit: 2021-08-24 | Discharge: 2021-08-24 | Disposition: A | Discharge status: Home / Self Care | Payer: Medicare Other | Source: Ambulatory Visit | Attending: Infectious Diseases | Admitting: Infectious Diseases

## 2021-08-24 DIAGNOSIS — Z1231 Encounter for screening mammogram for malignant neoplasm of breast: Secondary | ICD-10-CM | POA: Diagnosis not present

## 2022-02-04 IMAGING — MG DIGITAL SCREENING BREAST BILAT IMPLANT W/ TOMO W/ CAD
9 of 12 series · 9 of 28 positions shown · non-contrast
Comparison: Previous exam(s).

CLINICAL DATA: Screening.

EXAM:
DIGITAL SCREENING BILATERAL MAMMOGRAM WITH IMPLANTS, CAD AND
TOMOSYNTHESIS
TECHNIQUE: Bilateral screening digital craniocaudal and mediolateral oblique
mammograms were obtained. Bilateral screening digital breast
tomosynthesis was performed. The images were evaluated with
computer-aided detection. Standard and/or implant displaced views
were performed.

[L CC]
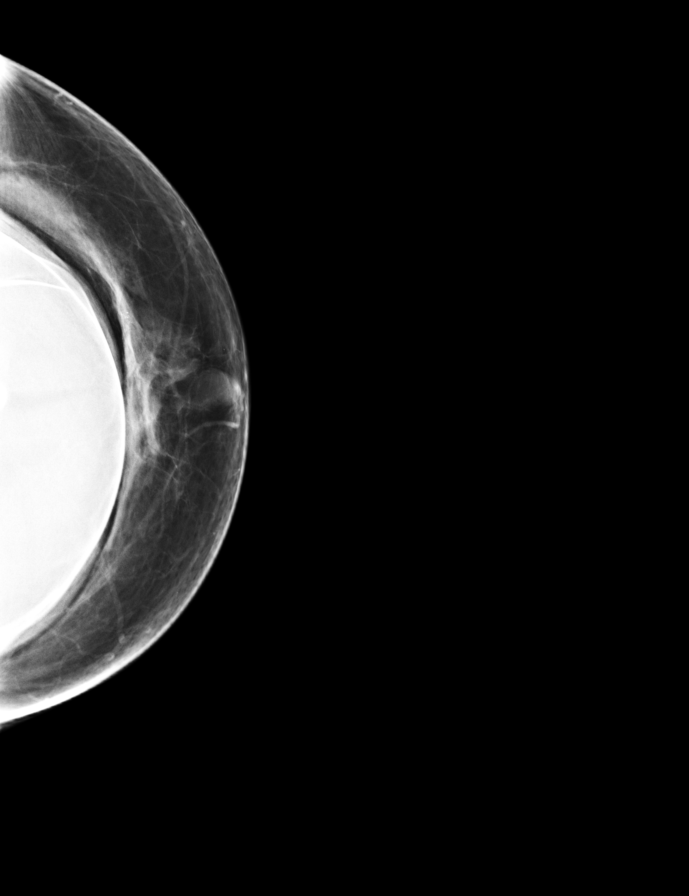

[R CC]
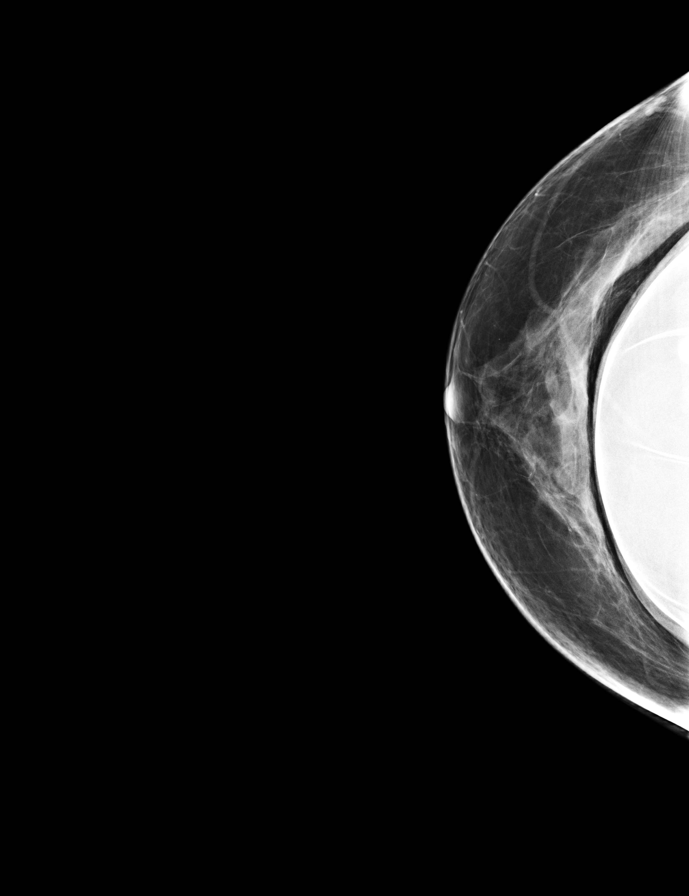

[R MLO]
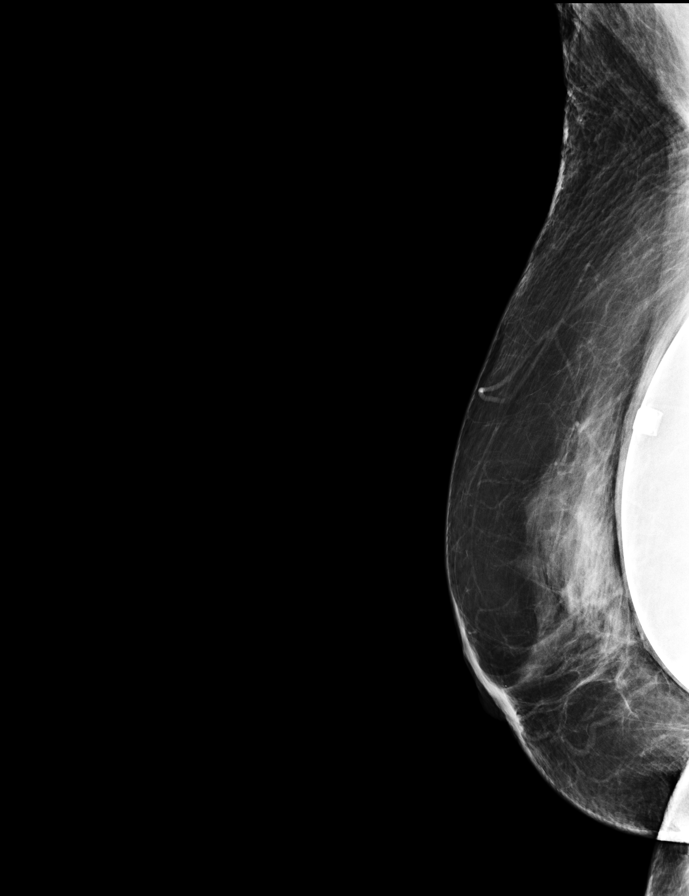

[L MLO]
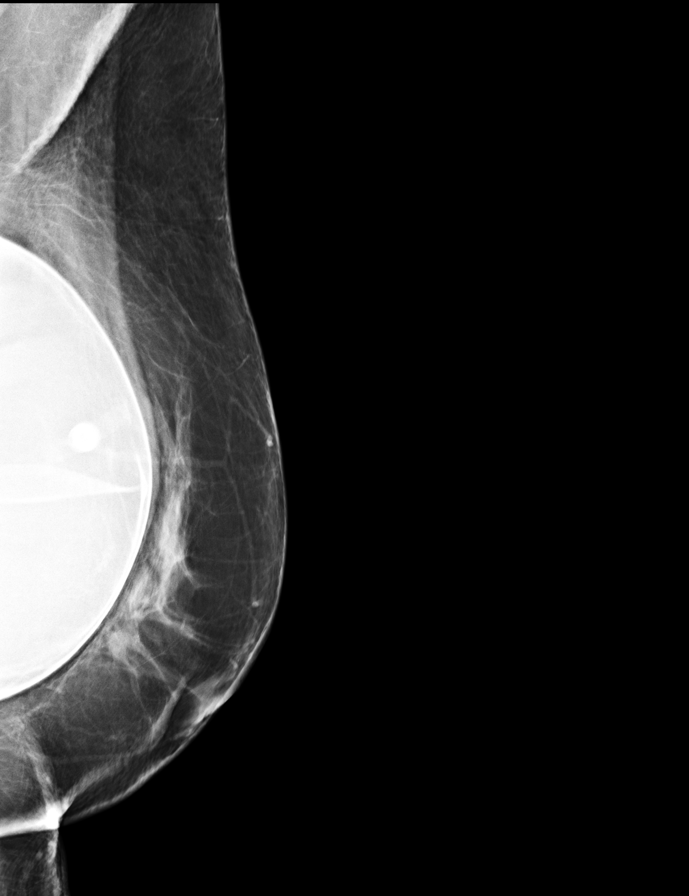

[L CC synth-2D]
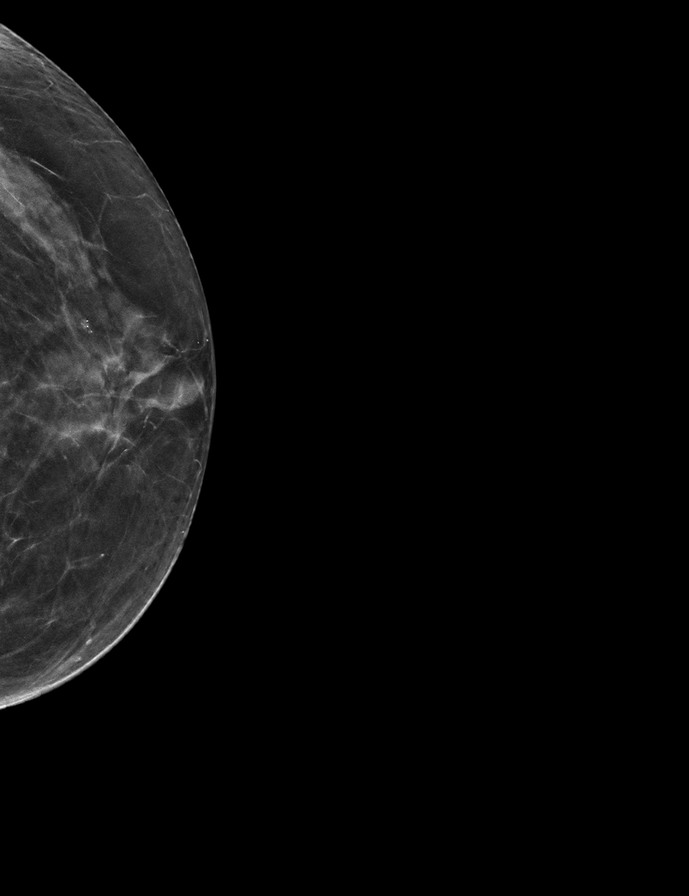

[R CC synth-2D]
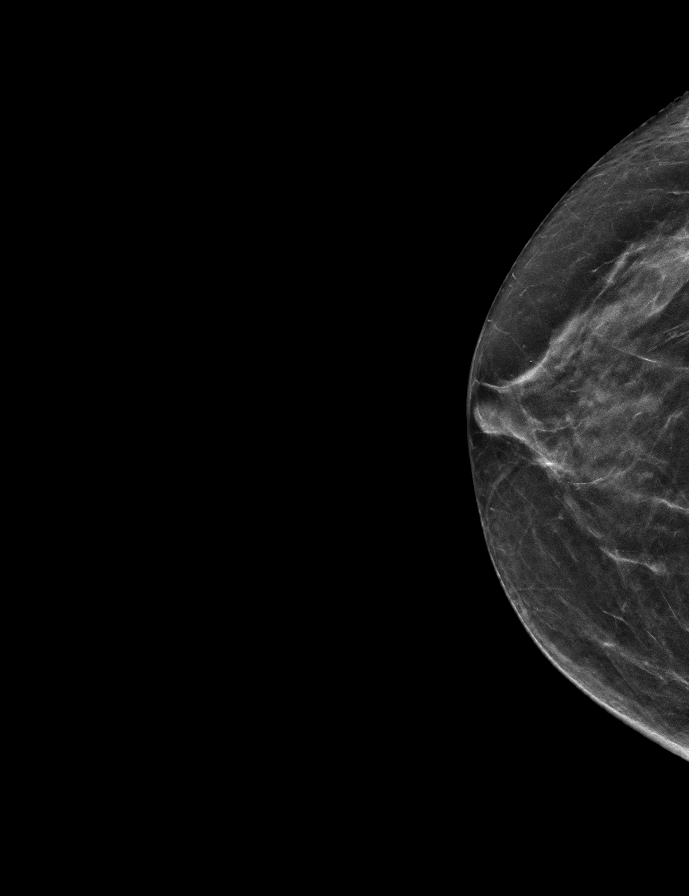

[L MLO synth-2D]
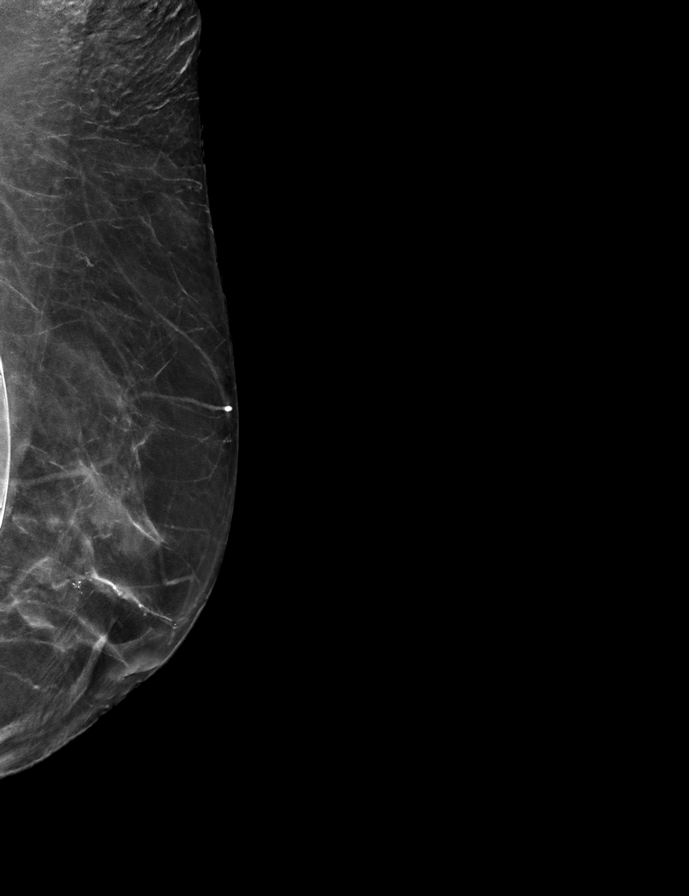

[R MLO synth-2D]
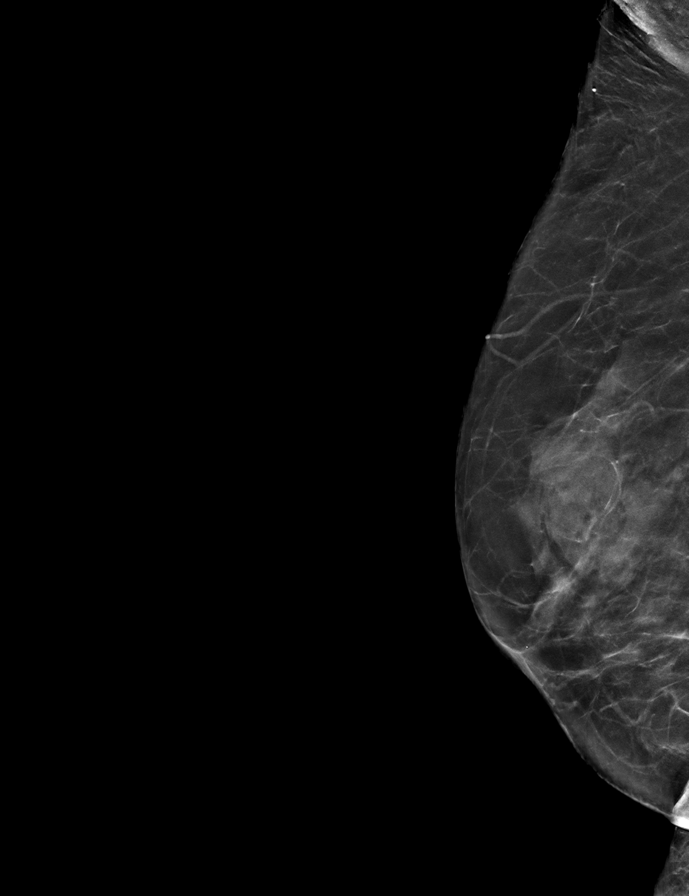

[R CCID BREAST TOMOSYNTHESIS IMAGE tomo · tomo slice 24/47.0]
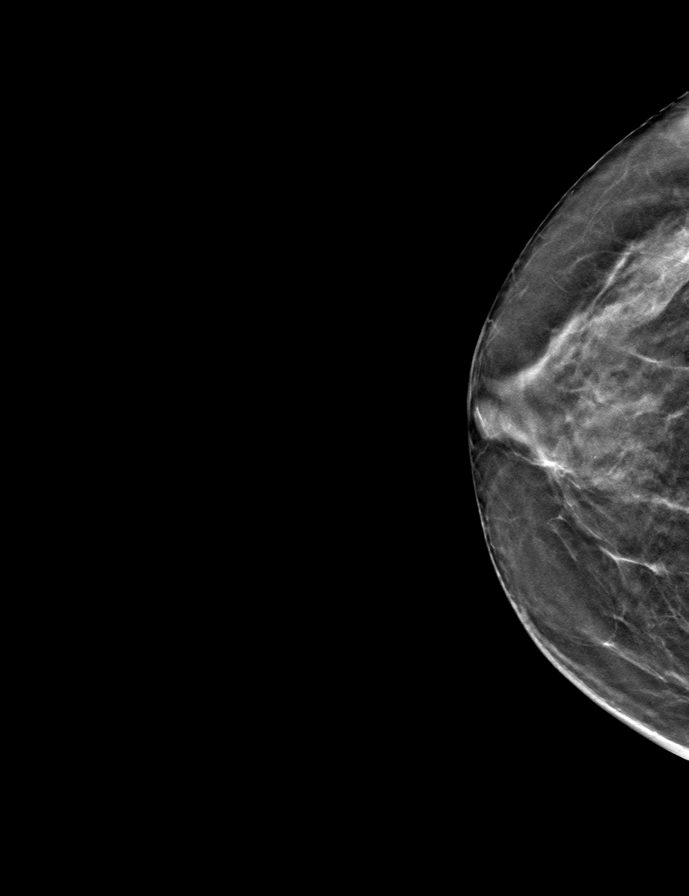

[9 of 28 positions shown; findings below may reference images not displayed]

ACR Breast Density Category c: The breast tissue is heterogeneously
dense, which may obscure small masses.
FINDINGS: The patient has retropectoral implants. There are no findings
suspicious for malignancy.
IMPRESSION: No mammographic evidence of malignancy. A result letter of this
screening mammogram will be mailed directly to the patient.

RECOMMENDATION:
Screening mammogram in one year. (Code:LT-E-7TH)

BI-RADS CATEGORY  1:  Negative.

## 2022-07-01 ENCOUNTER — Emergency Department
Admission: EM | Admit: 2022-07-01 | Discharge: 2022-07-01 | Disposition: A | Discharge status: Home / Self Care | Payer: Medicare Other | Attending: Emergency Medicine | Admitting: Emergency Medicine

## 2022-07-01 ENCOUNTER — Encounter: Payer: Self-pay | Admitting: Emergency Medicine

## 2022-07-01 ENCOUNTER — Emergency Department: Payer: Medicare Other

## 2022-07-01 ENCOUNTER — Other Ambulatory Visit: Payer: Self-pay

## 2022-07-01 DIAGNOSIS — J988 Other specified respiratory disorders: Secondary | ICD-10-CM | POA: Insufficient documentation

## 2022-07-01 DIAGNOSIS — Z20822 Contact with and (suspected) exposure to covid-19: Secondary | ICD-10-CM | POA: Diagnosis not present

## 2022-07-01 DIAGNOSIS — R509 Fever, unspecified: Secondary | ICD-10-CM | POA: Diagnosis present

## 2022-07-01 DIAGNOSIS — J449 Chronic obstructive pulmonary disease, unspecified: Secondary | ICD-10-CM | POA: Insufficient documentation

## 2022-07-01 DIAGNOSIS — J22 Unspecified acute lower respiratory infection: Secondary | ICD-10-CM

## 2022-07-01 LAB — RESP PANEL BY RT-PCR (FLU A&B, COVID) ARPGX2
Influenza A by PCR: NEGATIVE
Influenza B by PCR: NEGATIVE
SARS Coronavirus 2 by RT PCR: NEGATIVE

## 2022-07-01 LAB — URINALYSIS, ROUTINE W REFLEX MICROSCOPIC
Bilirubin Urine: NEGATIVE
Glucose, UA: NEGATIVE mg/dL
Hgb urine dipstick: NEGATIVE
Ketones, ur: NEGATIVE mg/dL
Leukocytes,Ua: NEGATIVE
Nitrite: NEGATIVE
Protein, ur: NEGATIVE mg/dL
Specific Gravity, Urine: 1.001 — ABNORMAL LOW (ref 1.005–1.030)
pH: 7 (ref 5.0–8.0)

## 2022-07-01 MED ORDER — PSEUDOEPHEDRINE HCL 30 MG PO TABS: 30.0000 mg | ORAL_TABLET | Freq: Four times a day (QID) | ORAL | 2 refills | Status: AC | PRN

## 2022-07-01 MED ORDER — ONDANSETRON 4 MG PO TBDP: 4.0000 mg | ORAL_TABLET | Freq: Three times a day (TID) | ORAL | 0 refills | Status: DC | PRN

## 2022-07-01 MED ORDER — DOXYCYCLINE MONOHYDRATE 100 MG PO TABS: 100.0000 mg | ORAL_TABLET | Freq: Two times a day (BID) | ORAL | 0 refills | Status: AC

## 2022-07-01 NOTE — ED Provider Notes (Signed)
[facility] Provider Note    Event Date/Time   First MD Initiated Contact with Patient 07/01/22 1001     (approximate)   History   Chief Complaint Fever   HPI Kim Farmer is a 71 y.o. female, history of anxiety, GERD, COPD, hyperlipidemia, depression, presents to the emergency department for evaluation of fever/congestion x4 days.  Patient states that she has been feeling a lot of mucus in her nose as well as in her chest.  She has been taking Mucinex, but has reportedly not had any significant relief.  She additionally states that she had a fever at one point as well, in addition to some abdominal discomfort and nausea.  Denies chest pain, shortness of breath, abdominal pain, flank pain, nausea/vomiting, diarrhea, dysuria, headache, rash/lesions, or dizziness/lightheadedness.  History Limitations: No limitations.        Physical Exam  Triage Vital Signs: ED Triage Vitals  Enc Vitals Group     BP 07/01/22 0958 (!) 151/73     Pulse Rate 07/01/22 0958 60     Resp 07/01/22 0958 18     Temp 07/01/22 0958 98.5 F (36.9 C)     Temp Source 07/01/22 0958 Oral     SpO2 07/01/22 0958 96 %     Weight 07/01/22 0957 147 lb 14.9 oz (67.1 kg)     Height 07/01/22 0957 '5\' 3"'$  (1.6 m)     Head Circumference --      Peak Flow --      Pain Score 07/01/22 0958 5     Pain Loc --      Pain Edu? --      Excl. in [place]? --     Most recent vital signs: Vitals:   07/01/22 0958  BP: (!) 151/73  Pulse: 60  Resp: 18  Temp: 98.5 F (36.9 C)  SpO2: 96%    General: Awake, NAD.  Skin: Warm, dry. No rashes or lesions.  Eyes: PERRL. Conjunctivae normal.  CV: Good peripheral perfusion.  Resp: Normal effort.  Mild rhonchi/wheezing on exam. Abd: Soft, non-tender. No distention.  Neuro: At baseline. No gross neurological deficits.  Musculoskeletal: Normal ROM of all extremities.   Physical Exam    ED Results / Procedures / Treatments  Labs (all labs ordered are  listed, but only abnormal results are displayed) Labs Reviewed  URINALYSIS, ROUTINE W REFLEX MICROSCOPIC - Abnormal; Notable for the following components:      Result Value   Color, Urine COLORLESS (*)    APPearance CLEAR (*)    Specific Gravity, Urine 1.001 (*)    All other components within normal limits  RESP PANEL BY RT-PCR (FLU A&B, COVID) [id number]     EKG N/A.    RADIOLOGY  ED Provider Interpretation: I personally viewed and interpreted this x-ray, no evidence of pneumonia at this time.  DG Chest 2 View  Result Date: 07/01/2022 CLINICAL DATA:  Rule out pneumonia, fever, congestion since Sunday EXAM: CHEST - 2 VIEW COMPARISON:  11/25/2020 FINDINGS: Lungs are hyperinflated as can be seen with COPD. Mild bilateral chronic interstitial thickening. No focal consolidation. No pleural effusion or pneumothorax. Heart and mediastinal contours are unremarkable. No acute osseous abnormality. IMPRESSION: 1. No active cardiopulmonary disease. 2. COPD. Electronically Signed   By: Kathreen Devoid M.D.   On: 07/01/2022 10:29    PROCEDURES:  Critical Care performed: N/A.  Procedures    MEDICATIONS ORDERED IN ED: Medications - No data to display   IMPRESSION /  MDM / ASSESSMENT AND PLAN / ED COURSE  I reviewed the triage vital signs and the nursing notes.                              Differential diagnosis includes, but is not limited to, COVID-19, influenza, community-acquired pneumonia, viral URI, sinusitis.  ED Course Patient appears well, vitals within normal limits.  NAD.  Urinalysis shows no evidence of infection.  Assessment/Plan Presentation consistent with possible lower respiratory infection.  Chest x-ray does not show any evidence of active pneumonia at this time.  I suspect likely viral etiology.  Respiratory panel is pending, however patient states that she does not want to wait any longer and would like to go home now.  Given her age and risk factors, will treat  empirically for possible early developing pneumonia.  Recommend that she follow-up with her regular doctor within the next few days.  Provide her with prescriptions for ondansetron and pseudoephedrine to help manage her symptoms as well.  Will discharge.  Provided the patient with anticipatory guidance, return precautions, and educational material. Encouraged the patient to return to the emergency department at any time if they begin to experience any new or worsening symptoms. Patient expressed understanding and agreed with the plan.   Patient's presentation is most consistent with acute complicated illness / injury requiring diagnostic workup.       FINAL CLINICAL IMPRESSION(S) / ED DIAGNOSES   Final diagnoses:  Lower respiratory infection (e.g., bronchitis, pneumonia, pneumonitis, pulmonitis)     Rx / DC Orders   ED Discharge Orders          Ordered    doxycycline (ADOXA) 100 MG tablet  2 times daily        07/01/22 1303    ondansetron (ZOFRAN-ODT) 4 MG disintegrating tablet  Every 8 hours PRN        07/01/22 1303    pseudoephedrine (SUDAFED) 30 MG tablet  Every 6 hours PRN        07/01/22 1303             Note:  This document was prepared using Dragon voice recognition software and may include unintentional dictation errors.   Teodoro Spray, [place] 07/01/22 1448    Lucillie Garfinkel, MD 07/02/22 [phone]

## 2022-07-01 NOTE — Discharge Instructions (Signed)
-  Please take the full course of the doxycycline to help treat/prevent pneumonia.  -You may additionally take ondansetron as needed for nausea/.  -You may additionally take the pseudoephedrine to help with the sinus congestion.  -Please follow-up with your primary care provider within the next 2 to 3 days for reevaluation.  -Return to the emergency department anytime if you begin to experience any new or worsening symptoms.

## 2022-07-01 NOTE — ED Triage Notes (Addendum)
Pt here with fever and congestion since Sunday. Pt states she has a lot of mucus and has been taking Mucinex with no relief.

## 2022-09-28 ENCOUNTER — Other Ambulatory Visit: Payer: Self-pay | Admitting: Infectious Diseases

## 2022-09-28 DIAGNOSIS — Z1231 Encounter for screening mammogram for malignant neoplasm of breast: Secondary | ICD-10-CM

## 2022-10-22 LAB — COLOGUARD: COLOGUARD: NEGATIVE

## 2022-10-29 ENCOUNTER — Ambulatory Visit
Admission: RE | Admit: 2022-10-29 | Discharge: 2022-10-29 | Disposition: A | Discharge status: Home / Self Care | Payer: 59 | Source: Ambulatory Visit | Attending: Infectious Diseases | Admitting: Infectious Diseases

## 2022-10-29 DIAGNOSIS — Z1231 Encounter for screening mammogram for malignant neoplasm of breast: Secondary | ICD-10-CM | POA: Diagnosis present

## 2023-08-17 DIAGNOSIS — F411 Generalized anxiety disorder: Secondary | ICD-10-CM | POA: Diagnosis present

## 2023-08-17 DIAGNOSIS — G8929 Other chronic pain: Secondary | ICD-10-CM | POA: Insufficient documentation

## 2023-11-07 ENCOUNTER — Other Ambulatory Visit: Payer: Self-pay | Admitting: Infectious Diseases

## 2023-11-07 DIAGNOSIS — R1013 Epigastric pain: Secondary | ICD-10-CM

## 2023-11-07 DIAGNOSIS — R110 Nausea: Secondary | ICD-10-CM

## 2023-11-21 ENCOUNTER — Other Ambulatory Visit
Admission: RE | Admit: 2023-11-21 | Discharge: 2023-11-21 | Disposition: A | Discharge status: Home / Self Care | Source: Ambulatory Visit | Attending: Nurse Practitioner | Admitting: Nurse Practitioner

## 2023-11-21 DIAGNOSIS — K920 Hematemesis: Secondary | ICD-10-CM | POA: Insufficient documentation

## 2023-11-21 DIAGNOSIS — R600 Localized edema: Secondary | ICD-10-CM | POA: Insufficient documentation

## 2023-11-21 DIAGNOSIS — R0609 Other forms of dyspnea: Secondary | ICD-10-CM | POA: Diagnosis present

## 2023-11-21 LAB — BRAIN NATRIURETIC PEPTIDE: B Natriuretic Peptide: 253.6 pg/mL — ABNORMAL HIGH (ref 0.0–100.0)

## 2023-11-22 DIAGNOSIS — R001 Bradycardia, unspecified: Secondary | ICD-10-CM | POA: Diagnosis present

## 2024-03-29 ENCOUNTER — Observation Stay
Admission: EM | Admit: 2024-03-29 | Discharge: 2024-04-01 | Disposition: A | Discharge status: Home / Self Care | Attending: Internal Medicine | Admitting: Internal Medicine

## 2024-03-29 ENCOUNTER — Other Ambulatory Visit: Payer: Self-pay

## 2024-03-29 ENCOUNTER — Emergency Department

## 2024-03-29 DIAGNOSIS — K2270 Barrett's esophagus without dysplasia: Secondary | ICD-10-CM | POA: Insufficient documentation

## 2024-03-29 DIAGNOSIS — R001 Bradycardia, unspecified: Secondary | ICD-10-CM | POA: Insufficient documentation

## 2024-03-29 DIAGNOSIS — Z7982 Long term (current) use of aspirin: Secondary | ICD-10-CM | POA: Diagnosis not present

## 2024-03-29 DIAGNOSIS — G894 Chronic pain syndrome: Secondary | ICD-10-CM | POA: Diagnosis not present

## 2024-03-29 DIAGNOSIS — J449 Chronic obstructive pulmonary disease, unspecified: Secondary | ICD-10-CM | POA: Diagnosis not present

## 2024-03-29 DIAGNOSIS — R1110 Vomiting, unspecified: Secondary | ICD-10-CM | POA: Diagnosis present

## 2024-03-29 DIAGNOSIS — A419 Sepsis, unspecified organism: Secondary | ICD-10-CM | POA: Diagnosis not present

## 2024-03-29 DIAGNOSIS — Z79891 Long term (current) use of opiate analgesic: Secondary | ICD-10-CM | POA: Insufficient documentation

## 2024-03-29 DIAGNOSIS — F1090 Alcohol use, unspecified, uncomplicated: Secondary | ICD-10-CM | POA: Insufficient documentation

## 2024-03-29 DIAGNOSIS — N390 Urinary tract infection, site not specified: Secondary | ICD-10-CM | POA: Diagnosis not present

## 2024-03-29 DIAGNOSIS — I2510 Atherosclerotic heart disease of native coronary artery without angina pectoris: Secondary | ICD-10-CM | POA: Diagnosis present

## 2024-03-29 DIAGNOSIS — F411 Generalized anxiety disorder: Secondary | ICD-10-CM | POA: Insufficient documentation

## 2024-03-29 DIAGNOSIS — R519 Headache, unspecified: Secondary | ICD-10-CM | POA: Insufficient documentation

## 2024-03-29 DIAGNOSIS — I671 Cerebral aneurysm, nonruptured: Secondary | ICD-10-CM | POA: Insufficient documentation

## 2024-03-29 DIAGNOSIS — N3000 Acute cystitis without hematuria: Principal | ICD-10-CM

## 2024-03-29 DIAGNOSIS — R109 Unspecified abdominal pain: Secondary | ICD-10-CM | POA: Diagnosis not present

## 2024-03-29 DIAGNOSIS — I10 Essential (primary) hypertension: Secondary | ICD-10-CM | POA: Insufficient documentation

## 2024-03-29 DIAGNOSIS — G4733 Obstructive sleep apnea (adult) (pediatric): Secondary | ICD-10-CM | POA: Diagnosis not present

## 2024-03-29 DIAGNOSIS — I872 Venous insufficiency (chronic) (peripheral): Secondary | ICD-10-CM | POA: Insufficient documentation

## 2024-03-29 DIAGNOSIS — Z720 Tobacco use: Secondary | ICD-10-CM | POA: Insufficient documentation

## 2024-03-29 DIAGNOSIS — F17209 Nicotine dependence, unspecified, with unspecified nicotine-induced disorders: Secondary | ICD-10-CM | POA: Diagnosis present

## 2024-03-29 DIAGNOSIS — Z8719 Personal history of other diseases of the digestive system: Secondary | ICD-10-CM | POA: Diagnosis not present

## 2024-03-29 DIAGNOSIS — G8929 Other chronic pain: Secondary | ICD-10-CM | POA: Diagnosis present

## 2024-03-29 LAB — COMPREHENSIVE METABOLIC PANEL WITH GFR
ALT: 15 U/L (ref 0–44)
AST: 29 U/L (ref 15–41)
Albumin: 4.1 g/dL (ref 3.5–5.0)
Alkaline Phosphatase: 101 U/L (ref 38–126)
Anion gap: 11 (ref 5–15)
BUN: 10 mg/dL (ref 8–23)
CO2: 26 mmol/L (ref 22–32)
Calcium: 9.5 mg/dL (ref 8.9–10.3)
Chloride: 101 mmol/L (ref 98–111)
Creatinine, Ser: 0.96 mg/dL (ref 0.44–1.00)
GFR, Estimated: >60 mL/min (ref >60)
Glucose, Bld: 134 mg/dL — ABNORMAL HIGH (ref 70–99)
Potassium: 3.6 mmol/L (ref 3.5–5.1)
Sodium: 138 mmol/L (ref 135–145)
Total Bilirubin: 0.7 mg/dL (ref 0.0–1.2)
Total Protein: 7.1 g/dL (ref 6.5–8.1)

## 2024-03-29 LAB — URINALYSIS, ROUTINE W REFLEX MICROSCOPIC
Bilirubin Urine: NEGATIVE
Glucose, UA: NEGATIVE mg/dL
Ketones, ur: NEGATIVE mg/dL
Nitrite: POSITIVE — AB
Protein, ur: 30 mg/dL — AB
Specific Gravity, Urine: 1.014 (ref 1.005–1.030)
WBC, UA: >50 WBC/hpf (ref 0–5)
pH: 5 (ref 5.0–8.0)

## 2024-03-29 LAB — CBC
HCT: 34.5 % — ABNORMAL LOW (ref 36.0–46.0)
Hemoglobin: 11.6 g/dL — ABNORMAL LOW (ref 12.0–15.0)
MCH: 29.8 pg (ref 26.0–34.0)
MCHC: 33.6 g/dL (ref 30.0–36.0)
MCV: 88.7 fL (ref 80.0–100.0)
Platelets: 302 K/uL (ref 150–400)
RBC: 3.89 MIL/uL (ref 3.87–5.11)
RDW: 12.8 % (ref 11.5–15.5)
WBC: 19.3 K/uL — ABNORMAL HIGH (ref 4.0–10.5)
nRBC: 0 % (ref 0.0–0.2)

## 2024-03-29 LAB — LIPASE, BLOOD: Lipase: 28 U/L (ref 11–51)

## 2024-03-29 LAB — LACTIC ACID, PLASMA
Lactic Acid, Venous: 2.6 mmol/L (ref 0.5–1.9)
Lactic Acid, Venous: 2 mmol/L (ref 0.5–1.9)

## 2024-03-29 LAB — TROPONIN I (HIGH SENSITIVITY): Troponin I (High Sensitivity): 13 ng/L (ref <18)

## 2024-03-29 MED ORDER — SODIUM CHLORIDE 0.9 % IV SOLN
1.0000 g | Freq: Once | INTRAVENOUS | Status: AC
  Administered 2024-03-29: 1 g via INTRAVENOUS
  Filled 2024-03-29: qty 20

## 2024-03-29 MED ORDER — MEMANTINE HCL 5 MG PO TABS
5.0000 mg | ORAL_TABLET | Freq: Two times a day (BID) | ORAL | Status: DC
Start: 2024-03-29 — End: 2024-04-01
  Administered 2024-03-29 – 2024-04-01 (×6): 5 mg via ORAL
  Filled 2024-03-29 (×6): qty 1

## 2024-03-29 MED ORDER — IOHEXOL 350 MG/ML SOLN
100.0000 mL | Freq: Once | INTRAVENOUS | Status: AC | PRN
  Administered 2024-03-29: 100 mL via INTRAVENOUS

## 2024-03-29 MED ORDER — FLUTICASONE PROPIONATE 50 MCG/ACT NA SUSP
2.0000 | Freq: Every day | NASAL | Status: DC
  Administered 2024-03-30 – 2024-04-01 (×3): 2 via NASAL
  Filled 2024-03-29: qty 16

## 2024-03-29 MED ORDER — VORTIOXETINE HBR 5 MG PO TABS
20.0000 mg | ORAL_TABLET | Freq: Every day | ORAL | Status: DC
Start: 2024-03-29 — End: 2024-04-01
  Administered 2024-03-30 – 2024-04-01 (×4): 20 mg via ORAL
  Filled 2024-03-29 (×5): qty 4

## 2024-03-29 MED ORDER — NICOTINE 14 MG/24HR TD PT24
14.0000 mg | MEDICATED_PATCH | Freq: Every day | TRANSDERMAL | Status: DC
  Filled 2024-03-29 (×2): qty 1

## 2024-03-29 MED ORDER — PROCHLORPERAZINE EDISYLATE 10 MG/2ML IJ SOLN
5.0000 mg | Freq: Once | INTRAMUSCULAR | Status: AC
  Administered 2024-03-29: 5 mg via INTRAVENOUS
  Filled 2024-03-29: qty 2

## 2024-03-29 MED ORDER — ATORVASTATIN CALCIUM 20 MG PO TABS
40.0000 mg | ORAL_TABLET | Freq: Every day | ORAL | Status: DC
  Administered 2024-03-30 – 2024-04-01 (×3): 40 mg via ORAL
  Filled 2024-03-29 (×3): qty 2

## 2024-03-29 MED ORDER — BUSPIRONE HCL 10 MG PO TABS
5.0000 mg | ORAL_TABLET | Freq: Three times a day (TID) | ORAL | Status: DC
  Administered 2024-03-29 – 2024-04-01 (×9): 5 mg via ORAL
  Filled 2024-03-29 (×9): qty 1

## 2024-03-29 MED ORDER — DIPHENHYDRAMINE HCL 50 MG/ML IJ SOLN
25.0000 mg | Freq: Once | INTRAMUSCULAR | Status: AC
  Administered 2024-03-29: 25 mg via INTRAVENOUS
  Filled 2024-03-29: qty 1

## 2024-03-29 MED ORDER — OXYCODONE HCL 5 MG PO TABS
10.0000 mg | ORAL_TABLET | Freq: Once | ORAL | Status: AC
  Administered 2024-03-29: 10 mg via ORAL
  Filled 2024-03-29: qty 2

## 2024-03-29 MED ORDER — PANTOPRAZOLE SODIUM 40 MG IV SOLR
40.0000 mg | INTRAVENOUS | Status: DC
  Administered 2024-03-29 – 2024-03-31 (×3): 40 mg via INTRAVENOUS
  Filled 2024-03-29 (×3): qty 10

## 2024-03-29 MED ORDER — LACTATED RINGERS IV SOLN
150.0000 mL/h | INTRAVENOUS | Status: AC
  Administered 2024-03-29 – 2024-03-30 (×2): 150 mL/h via INTRAVENOUS

## 2024-03-29 MED ORDER — IOHEXOL 350 MG/ML SOLN: 75.0000 mL | Freq: Once | INTRAVENOUS | Status: DC | PRN

## 2024-03-29 MED ORDER — LEVOFLOXACIN IN D5W 250 MG/50ML IV SOLN
250.0000 mg | INTRAVENOUS | Status: DC
  Administered 2024-03-30 (×2): 250 mg via INTRAVENOUS
  Filled 2024-03-29 (×4): qty 50

## 2024-03-29 MED ORDER — ACETAMINOPHEN 500 MG PO TABS
1000.0000 mg | ORAL_TABLET | Freq: Once | ORAL | Status: AC
  Administered 2024-03-29: 1000 mg via ORAL
  Filled 2024-03-29: qty 2

## 2024-03-29 MED ORDER — OXYCODONE HCL 5 MG PO TABS
10.0000 mg | ORAL_TABLET | ORAL | Status: DC | PRN
  Administered 2024-03-29 – 2024-04-01 (×11): 10 mg via ORAL
  Filled 2024-03-29 (×11): qty 2

## 2024-03-29 MED ORDER — ACETAMINOPHEN 325 MG PO TABS
650.0000 mg | ORAL_TABLET | Freq: Four times a day (QID) | ORAL | Status: DC | PRN
  Administered 2024-03-29 – 2024-04-01 (×5): 650 mg via ORAL
  Filled 2024-03-29 (×5): qty 2

## 2024-03-29 MED ORDER — SODIUM CHLORIDE 0.9 % IV BOLUS
1000.0000 mL | Freq: Once | INTRAVENOUS | Status: AC
  Administered 2024-03-29: 1000 mL via INTRAVENOUS

## 2024-03-29 MED ORDER — ROFLUMILAST 500 MCG PO TABS
500.0000 ug | ORAL_TABLET | Freq: Every day | ORAL | Status: DC
  Administered 2024-03-30 – 2024-04-01 (×4): 500 ug via ORAL
  Filled 2024-03-29 (×5): qty 1

## 2024-03-29 MED ORDER — BUDESON-GLYCOPYRROL-FORMOTEROL 160-9-4.8 MCG/ACT IN AERO
2.0000 | INHALATION_SPRAY | Freq: Two times a day (BID) | RESPIRATORY_TRACT | Status: DC
  Administered 2024-03-30 – 2024-04-01 (×5): 2 via RESPIRATORY_TRACT
  Filled 2024-03-29 (×2): qty 5.9

## 2024-03-29 MED ORDER — POTASSIUM CHLORIDE CRYS ER 20 MEQ PO TBCR
20.0000 meq | EXTENDED_RELEASE_TABLET | Freq: Every day | ORAL | Status: DC
  Administered 2024-03-29 – 2024-04-01 (×4): 20 meq via ORAL
  Filled 2024-03-29 (×5): qty 1

## 2024-03-29 MED ORDER — TAMSULOSIN HCL 0.4 MG PO CAPS
0.8000 mg | ORAL_CAPSULE | Freq: Every evening | ORAL | Status: DC
  Administered 2024-03-29 – 2024-04-01 (×4): 0.8 mg via ORAL
  Filled 2024-03-29 (×4): qty 2

## 2024-03-29 MED ORDER — ONDANSETRON HCL 4 MG/2ML IJ SOLN
4.0000 mg | Freq: Once | INTRAMUSCULAR | Status: AC
  Administered 2024-03-29: 4 mg via INTRAVENOUS
  Filled 2024-03-29: qty 2

## 2024-03-29 MED ORDER — ONDANSETRON HCL 4 MG PO TABS
4.0000 mg | ORAL_TABLET | Freq: Four times a day (QID) | ORAL | Status: DC | PRN
Start: 2024-03-29 — End: 2024-04-01

## 2024-03-29 MED ORDER — ONDANSETRON HCL 4 MG/2ML IJ SOLN
4.0000 mg | Freq: Four times a day (QID) | INTRAMUSCULAR | Status: DC | PRN
  Administered 2024-03-30 – 2024-04-01 (×4): 4 mg via INTRAVENOUS
  Filled 2024-03-29 (×4): qty 2

## 2024-03-29 MED ORDER — ACETAMINOPHEN 650 MG RE SUPP: 650.0000 mg | Freq: Four times a day (QID) | RECTAL | Status: DC | PRN

## 2024-03-29 NOTE — Assessment & Plan Note (Signed)
 Continue home buspirone  and other meds, pending med rec

## 2024-03-29 NOTE — ED Notes (Signed)
 First nurse attempted to call Primary RN without success

## 2024-03-29 NOTE — Assessment & Plan Note (Signed)
 Noted to be bradycardia in the 50s in the ED, asymptomatic Not on rate control agents Continue to monitor

## 2024-03-29 NOTE — H&P (Signed)
 History and Physical    Patient: Kim Farmer FMW:[id number] DOB: 02/04/1951 DOA: 03/29/2024 DOS: the patient was seen and examined on 03/29/2024 PCP: Epifanio Alm SQUIBB, MD  Patient coming from: Home  Chief Complaint:  Chief Complaint  Patient presents with   Emesis    HPI: Kim Farmer is a 73 y.o. female with medical history significant for non-obstructive coronary artery disease, chronic dyspnea, chronic venous stasis, COPD, HTN, OSA(), chronic pain syndrome on chronic opiates history of Barrett's esophagus/s/p esophageal dilatation(scheduled for EGD in November), being admitted for UTI with possible sepsis after presenting with vomiting and diarrhea that started on the day of arrival.  She denies abdominal pain, chest pain, shortness of breath or dysuria..  She did endorse a headache, similar to prior headaches, worse with vomiting.  Denies fever or chills. On arrival in the ED temp of 99.9 with BP 102/69 and otherwise normal vitals. Labs notable for WBC 19,000 and lactic acid 2.6--2.0. Urinalysis consistent with UTI Lipase and LFTs WNL, CMP otherwise unremarkable Hemoglobin 11.6, near baseline EKG showed sinus at 59 CT abdomen and pelvis showed diffuse bladder wall thickening consistent with cystitis.  Also showed progression of pancreatic ductal dilatation of uncertain significance-please refer to full report CT angio head and neck notable for 5 mm saccular aneurysm left PICA among other nonacute findings  The ED provider spoke with neurosurgeon Dr. Claudene who advised no intervention needed for the aneurysm and patient can follow-up as outpatient.  She was given a migraine cocktail  For UTI, possible sepsis, patient treated with an NS bolus and started on meropenem  due to history of shortness of breath with penicillins  Admission requested     Past Medical History:  Diagnosis Date   Anxiety    Arthritis    back, hands, feet   Asthma    Barrett esophagus    Bulging  lumbar disc    Chronic pain    COPD (chronic obstructive pulmonary disease) (HCC)    Degenerative disc disease, cervical    limited left turn   Depression    GERD (gastroesophageal reflux disease)    Hepatitis    age 37 or 4. hospitalized.    Hiatal hernia    Hyperlipidemia    Osteopenia    Presence of dental bridge    permanent upper   Sciatica    Sleep apnea    CPAP   Past Surgical History:  Procedure Laterality Date   ABDOMINAL HYSTERECTOMY     AUGMENTATION MAMMAPLASTY Bilateral 2001   BREAST SURGERY     saline implants   CARDIAC CATHETERIZATION Left 06/10/2016   Procedure: Left Heart Cath and Coronary Angiography;  Surgeon: Cara JONETTA Lovelace, MD;  Location: [facility];  Service: Cardiovascular;  Laterality: Left;   ESOPHAGEAL DILATION     ESOPHAGOGASTRODUODENOSCOPY (EGD) WITH PROPOFOL  N/A 08/07/2015   Procedure: ESOPHAGOGASTRODUODENOSCOPY (EGD) WITH PROPOFOL  with balloon dilation;  Surgeon: Rogelia Copping, MD;  Location: [facility];  Service: Endoscopy;  Laterality: N/A;   EYE SURGERY     HAMMER TOE SURGERY Right    Social History:  reports that she has been smoking cigarettes. She started smoking about 48 years ago. She has a 24.4 pack-year smoking history. She has never used smokeless tobacco. She reports current alcohol use. She reports that she does not use drugs.  Allergies  Allergen Reactions   Penicillins Shortness Of Breath    Throat swells   Sulfa Antibiotics Other (See Comments)    Unknown reaction as  child    Family History  Problem Relation Age of Onset   Diabetes Mother    Hypertension Mother    Diabetes Father    Hypertension Father    COPD Sister    Diabetes Sister    Emphysema Sister    Breast cancer Maternal Aunt    Breast cancer Maternal Grandmother     Prior to Admission medications   Medication Sig Start Date End Date Taking? Authorizing Provider  albuterol  (PROAIR  HFA) 108 (90 BASE) MCG/ACT inhaler Inhale 1 puff into  the lungs every 6 (six) hours as needed.  05/23/15  Yes [provider]  atorvastatin  (LIPITOR) 40 MG tablet TAKE 1 TABLET(40 MG) BY MOUTH DAILY 02/27/16  Yes Sowles, Krichna, MD  busPIRone  (BUSPAR ) 5 MG tablet Take 5 mg by mouth 3 (three) times daily. 03/27/24  Yes [provider]  fluticasone  (FLONASE ) 50 MCG/ACT nasal spray SHAKE LIQUID AND USE 2 SPRAYS IN EACH NOSTRIL DAILY 06/24/16  Yes Sowles, Krichna, MD  furosemide (LASIX) 20 MG tablet Take 20 mg by mouth daily.   Yes [provider]  lidocaine  (LIDODERM ) 5 % Place 1 patch onto the skin daily. 01/26/24  Yes [provider]  losartan (COZAAR) 100 MG tablet Take 100 mg by mouth daily.   Yes [provider]  memantine  (NAMENDA ) 5 MG tablet Take 5 mg by mouth 2 (two) times daily.   Yes [provider]  mupirocin ointment (BACTROBAN) 2 % Apply 1 Application topically 3 (three) times daily. 02/21/24  Yes [provider]  naloxone (NARCAN) nasal spray 4 mg/0.1 mL Place 4 mg into the nose once. 09/22/23  Yes [provider]  omeprazole (PRILOSEC) 40 MG capsule Take 40 mg by mouth 2 (two) times daily before a meal. 03/22/24  Yes [provider]  ondansetron  (ZOFRAN -ODT) 8 MG disintegrating tablet Take 8 mg by mouth 3 (three) times daily.   Yes [provider]  Oxycodone  HCl 10 MG TABS Take 10 mg by mouth every 4 (four) hours as needed (Pain). 03/03/24 04/02/24 Yes [provider]  potassium chloride  SA (KLOR-CON  M) 20 MEQ tablet Take 20 mEq by mouth daily.   Yes [provider]  RESTASIS 0.05 % ophthalmic emulsion Place 1 drop into both eyes 2 (two) times daily.  07/17/15  Yes [provider]  roflumilast  (DALIRESP ) 500 MCG TABS tablet Take 500 mcg by mouth daily.   Yes [provider]  tamsulosin  (FLOMAX ) 0.4 MG CAPS capsule Take 0.8 mg by mouth daily.   Yes [provider]  TRELEGY ELLIPTA 100-62.5-25 MCG/ACT AEPB Inhale 1 puff  into the lungs daily.   Yes [provider]  triamcinolone  cream (KENALOG ) 0.1 % Apply 1 Application topically 2 (two) times daily. 11/14/23  Yes [provider]  TRINTELLIX  20 MG TABS tablet Take 20 mg by mouth daily.   Yes [provider]  valACYclovir (VALTREX) 500 MG tablet Take 500 mg by mouth 2 (two) times daily.   Yes [provider]  varenicline  (CHANTIX ) 1 MG tablet Take 1 mg by mouth 2 (two) times daily.   Yes [provider]  venlafaxine XR (EFFEXOR-XR) 37.5 MG 24 hr capsule Take 37.5 mg by mouth 2 (two) times daily.   Yes [provider]  XIFAXAN 550 MG TABS tablet Take 550 mg by mouth 2 (two) times daily. 12/21/23  Yes [provider]  ALPRAZolam SHEFFIELD) 1 MG tablet Take 1 mg by mouth 3 (three) times daily. Patient  not taking: Reported on 03/29/2024    [provider]  aspirin  EC 81 MG tablet Take 1 tablet (81 mg total) by mouth daily. Patient not taking: Reported on 03/29/2024 11/04/15   Sowles, Krichna, MD  famotidine (PEPCID) 20 MG tablet Take 20 mg by mouth 2 (two) times daily. Patient not taking: Reported on 03/29/2024    [provider]  oxybutynin (DITROPAN-XL) 5 MG 24 hr tablet Take 5 mg by mouth daily. Patient not taking: Reported on 03/29/2024    [provider]    Physical Exam: Vitals:   03/29/24 1506 03/29/24 1700 03/29/24 1807 03/29/24 1900  BP: (!) 148/64 128/62 (!) 144/85 (!) 124/51  Pulse: 60 (!) 56 (!) 54 (!) 56  Resp: 17 11 16 16   Temp: 98.8 F (37.1 C)  98.7 F (37.1 C)   TempSrc: Oral  Oral   SpO2: 100% 99% 97% 94%  Weight:       Physical Exam Vitals and nursing note reviewed.  Constitutional:      General: She is not in acute distress. HENT:     Head: Normocephalic and atraumatic.  Cardiovascular:     Rate and Rhythm: Regular rhythm. Bradycardia present.     Heart sounds: Normal heart sounds.  Pulmonary:     Effort: Pulmonary effort is normal.     Breath  sounds: Normal breath sounds.  Abdominal:     Palpations: Abdomen is soft.     Tenderness: There is no abdominal tenderness.  Neurological:     Mental Status: Mental status is at baseline.     Labs on Admission: I have personally reviewed following labs and imaging studies  CBC: Recent Labs  Lab 03/29/24 1108  WBC 19.3*  HGB 11.6*  HCT 34.5*  MCV 88.7  PLT 302   Basic Metabolic Panel: Recent Labs  Lab 03/29/24 1108  NA 138  K 3.6  CL 101  CO2 26  GLUCOSE 134*  BUN 10  CREATININE 0.96  CALCIUM  9.5   GFR: CrCl cannot be calculated (Unknown ideal weight.). Liver Function Tests: Recent Labs  Lab 03/29/24 1108  AST 29  ALT 15  ALKPHOS 101  BILITOT 0.7  PROT 7.1  ALBUMIN 4.1   Recent Labs  Lab 03/29/24 1108  LIPASE 28   No results for input(s): AMMONIA in the last 168 hours. Coagulation Profile: No results for input(s): INR, PROTIME in the last 168 hours. Cardiac Enzymes: No results for input(s): CKTOTAL, CKMB, CKMBINDEX, TROPONINI in the last 168 hours. BNP (last 3 results) No results for input(s): PROBNP in the last 8760 hours. HbA1C: No results for input(s): HGBA1C in the last 72 hours. CBG: No results for input(s): GLUCAP in the last 168 hours. Lipid Profile: No results for input(s): CHOL, HDL, LDLCALC, TRIG, CHOLHDL, LDLDIRECT in the last 72 hours. Thyroid Function Tests: No results for input(s): TSH, T4TOTAL, FREET4, T3FREE, THYROIDAB in the last 72 hours. Anemia Panel: No results for input(s): VITAMINB12, FOLATE, FERRITIN, TIBC, IRON, RETICCTPCT in the last 72 hours. Urine analysis:    Component Value Date/Time   COLORURINE YELLOW (A) 03/29/2024 1108   APPEARANCEUR HAZY (A) 03/29/2024 1108   LABSPEC 1.014 03/29/2024 1108   PHURINE 5.0 03/29/2024 1108   GLUCOSEU NEGATIVE 03/29/2024 1108   HGBUR SMALL (A) 03/29/2024 1108   BILIRUBINUR NEGATIVE 03/29/2024 1108   BILIRUBINUR neg  10/10/2015 1518   KETONESUR NEGATIVE 03/29/2024 1108   PROTEINUR 30 (A) 03/29/2024 1108   UROBILINOGEN negative 10/10/2015 1518   NITRITE  POSITIVE (A) 03/29/2024 1108   LEUKOCYTESUR SMALL (A) 03/29/2024 1108    Radiological Exams on Admission: CT ANGIO HEAD NECK W WO CM Result Date: 03/29/2024 EXAM: CTA HEAD AND NECK WITH AND WITHOUT 03/29/2024 05:30:30 PM TECHNIQUE: CTA of the head and neck was performed with and without the administration of intravenous contrast. Multiplanar 2D and/or 3D reformatted images are provided for review. Automated exposure control, iterative reconstruction, and/or weight based adjustment of the mA/kV was utilized to reduce the radiation dose to as low as reasonably achievable. Stenosis of the internal carotid arteries measured using NASCET criteria. COMPARISON: None available CLINICAL HISTORY: Headache, nausea, and vomiting. Patient has a history of surgery in January and reports emesis 5 out of 7 days every week since, with worsening symptoms today. Also reports diarrhea and headache. Patient took 2 ODT zofran  prior to EMS arrival. FINDINGS: CTA NECK: AORTIC ARCH AND ARCH VESSELS: Atherosclerosis of the aortic arch. No dissection or arterial injury. No significant stenosis of the brachiocephalic or subclavian arteries. CERVICAL CAROTID ARTERIES: Mild tortuosity of the proximal right common carotid artery with retropharyngeal course of the right common carotid artery and proximal right cervical ICA. No hemodynamically significant stenosis of the right cervical carotid artery. Additional tortuosity of the distal right cervical ICA. Retropharyngeal course of the left common carotid artery. No hemodynamically significant stenosis of the left cervical carotid artery. Tortuosity of the distal left cervical ICA. CERVICAL VERTEBRAL ARTERIES: The vertebral arteries are patent from the origins to the vertebrobasilar confluence. There is tortuosity of the left V1 segment. The V4 segment  of the left vertebral artery is diminished in caliber after the origin of the left PICA. LUNGS AND MEDIASTINUM: Unremarkable. SOFT TISSUES: No acute abnormality. BONES: Degenerative changes in the visualized spine. There is partial fusion of the C4 through C6 vertebral bodies. CTA HEAD: ANTERIOR CIRCULATION: Atherosclerosis of the carotid siphons. There is mild stenosis of the cavernous and supraclinoid internal carotid arteries. The middle cerebral arteries are patent bilaterally. The anterior cerebral arteries are patent bilaterally. POSTERIOR CIRCULATION: The basilar artery is patent and is primarily supplied via the right vertebral artery. The posterior cerebral arteries are patent bilaterally. The superior cerebellar arteries are patent bilaterally. There is a 5 x 4 x 4 mm saccular outpouching along the mid portion of the left PICA compatible with aneurysm seen on series 14 image 133 and series 15 image 143. OTHER: No dural venous sinus thrombosis on this non-dedicated study. Nonspecific hypoattenuation in the periventricular and subcortical white matter, most likely representing chronic small vessel disease. Mild parenchymal volume loss. Moderate right mastoid effusion. Degenerative changes of the mandibular condyles, greater on the right. IMPRESSION: 1. No large vessel occlusion or hemodynamically significant stenosis in the head or neck. 2. 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 3. Mild stenosis of the cavernous and supraclinoid internal carotid arteries. 4. Chronic microvascular ischemic changes and mild parenchymal volume loss. Electronically signed by: Donnice Mania MD 03/29/2024 06:53 PM EDT RP Workstation: [id number]   CT ABDOMEN PELVIS W CONTRAST Result Date: 03/29/2024 CLINICAL DATA:  Acute, non localized abdominal pain. Vomiting 5/7 days every week since surgery in January 2025. Diarrhea. EXAM: CT ABDOMEN AND PELVIS WITH CONTRAST TECHNIQUE: Multidetector CT imaging  of the abdomen and pelvis was performed using the standard protocol following bolus administration of intravenous contrast. RADIATION DOSE REDUCTION: This exam was performed according to the departmental dose-optimization program which includes automated exposure control, adjustment of the  mA and/or kV according to patient size and/or use of iterative reconstruction technique. CONTRAST:  OMNIPAQUE  IOHEXOL  350 MG/ML SOLN COMPARISON:  05/18/2015 FINDINGS: Lower chest: Stable mildly enlarged heart. Minimal pericardial effusion. Minimal linear scarring at the left lung base. Hepatobiliary: No focal liver abnormality is seen. No gallstones, gallbladder wall thickening, or biliary dilatation. Previously demonstrated lateral segment left lobe low-density masses are fewer and smaller, currently with appearances suggesting cysts. Pancreas: The pancreatic duct in the head of the pancreas remains dilated with mild progression, currently measuring 6.4 mm in diameter. This returns to normal size in the tail and distal body of the pancreas. No obstructing mass visualized. Spleen: Normal in size without focal abnormality. Adrenals/Urinary Tract: Interval 7 mm oval low-density mass in the mid to upper left kidney medially. This is too small to accurately characterize and has an appearance suggesting a small complicated cyst. Unremarkable adrenal glands, ureters and urinary bladder with the exception of interval minimal diffuse bladder wall thickening and enhancement. Stomach/Bowel: Unremarkable stomach, small bowel and colon. The appendix is not visualized. No secondary signs of appendicitis. Vascular/Lymphatic: Atheromatous arterial calcifications without aneurysm. No enlarged lymph nodes. Reproductive: Status post hysterectomy. No adnexal masses. Other: No abdominal wall hernia or abnormality. No abdominopelvic ascites. 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 Moderate bilateral hip degenerative changes. Mild thoracolumbar spine degenerative changes. IMPRESSION: 1. Interval minimal diffuse bladder wall thickening and enhancement. This can be seen with cystitis. 2. Mild progression of pancreatic ductal dilatation in the head of the pancreas, currently measuring 6.4 mm in diameter. This is of uncertain significance in the absence of a visualized mass. This is most likely related to progressive atrophy of those portions of the pancreas. 3. 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 4. Stable mild cardiomegaly. 5. Minimal pericardial effusion. Electronically Signed   By: Elspeth Bathe M.D.   On: 03/29/2024 18:44   Data Reviewed for HPI: Relevant notes from primary care and specialist visits, past discharge summaries as available in EHR, including Care Everywhere. Prior diagnostic testing as pertinent to current admission diagnoses Updated medications and problem lists for reconciliation ED course, including vitals, labs, imaging, treatment and response to treatment Triage notes, nursing and pharmacy notes and ED provider's notes Notable results as noted above in HPI      Assessment and Plan: * Sepsis secondary to UTI (HCC) UA consistent with UTI and CT showing possible cystitis Sepsis criteria include fever, leukocytosis and lactic acidosis to Sepsis fluids Levaquin  (given shortness of breath with penicillins) Follow cultures  Intractable vomiting History of esophageal stricture and Barrett's esophagus Protonix  and antiemetics GI consulted for possible EGD Will keep n.p.o. from midnight in case of EGD  Headache Brain aneurysm on CTA head and neck 03/29/2024 Treated with a migraine cocktail in the ED Neurosurgery was consulted from the ED-outpatient follow-up recommended no acute  intervention needed  Chronic prescription opiate use Chronic pain Continue home meds as tolerated  Sinus bradycardia Noted to be bradycardia in the 50s in the ED, asymptomatic Not on rate control agents Continue to monitor  Venous insufficiency (chronic) (peripheral) No acute issues Previously evaluated by cardiology with no evidence of CHF-had a normal echo in 2024 Continue furosemide  Coronary artery calcification seen on CT scan Nonobstructive CAD Continue aspirin ,  atorvastatin , losartan  OSA (obstructive sleep apnea) CPAP intolerant  Generalized anxiety disorder Continue home buspirone  and other meds, pending med rec  Essential hypertension BP was soft so we will cautiously resume home meds  Tobacco use disorder, continuous Nicotine  patch    DVT prophylaxis: SCD  Consults: GI  Advance Care Planning:   Code Status: Prior   Family Communication: none  Disposition Plan: Back to previous home environment  Severity of Illness: The appropriate patient status for this patient is OBSERVATION. Observation status is judged to be reasonable and necessary in order to provide the required intensity of service to ensure the patient's safety. The patient's presenting symptoms, physical exam findings, and initial radiographic and laboratory data in the context of their medical condition is felt to place them at decreased risk for further clinical deterioration. Furthermore, it is anticipated that the patient will be medically stable for discharge from the hospital within 2 midnights of admission.   Author: Delayne LULLA Solian, MD 03/29/2024 8:05 PM  For on call review [email or web address].

## 2024-03-29 NOTE — Assessment & Plan Note (Signed)
 Nonobstructive CAD Continue aspirin , atorvastatin , losartan

## 2024-03-29 NOTE — Assessment & Plan Note (Addendum)
 No acute issues Previously evaluated by cardiology with no evidence of CHF-had a normal echo in 2024 Continue furosemide

## 2024-03-29 NOTE — Assessment & Plan Note (Addendum)
 Brain aneurysm on CTA head and neck 03/29/2024 Treated with a migraine cocktail in the ED Neurosurgery was consulted from the ED-outpatient follow-up recommended no acute intervention needed

## 2024-03-29 NOTE — Assessment & Plan Note (Signed)
CPAP intolerant. 

## 2024-03-29 NOTE — ED Triage Notes (Signed)
 First nurse note: Pt to Ed via [facility] from home. Pt has surgery in Jan and reports emesis 5/7 days every week since and reports today is worse. Pt also reports diarrhea and HA. Pt took 2 ODT zofran  prior to Ems arrival.

## 2024-03-29 NOTE — Assessment & Plan Note (Addendum)
 UA consistent with UTI and CT showing possible cystitis Sepsis criteria include fever, leukocytosis and lactic acidosis to 2.6-which has been resolved now Urine cultures with gram-negative rod Continue Levaquin  Levaquin  (given shortness of breath with penicillins) Follow cultures

## 2024-03-29 NOTE — Assessment & Plan Note (Signed)
 BP was soft so we will cautiously resume home meds

## 2024-03-29 NOTE — Assessment & Plan Note (Addendum)
 History of esophageal stricture and Barrett's esophagus EGD today was negative for any esophageal narrowing but dilatation was tried anyway to help.  Seems like having chronic issue with nausea and vomiting. Protonix  and antiemetics Started on diet

## 2024-03-29 NOTE — ED Notes (Signed)
Pt helped to the bathroom to void 

## 2024-03-29 NOTE — ED Provider Notes (Signed)
-----------------------------------------   3:30 PM on 03/29/2024 -----------------------------------------  Blood pressure (!) 148/64, pulse 60, temperature 98.8 F (37.1 C), temperature source Oral, resp. rate 17, weight 67.1 kg, SpO2 100%.  Assuming care from Dr. Ernest.  In short, Kim Farmer is a 73 y.o. female with a chief complaint of Emesis .  Refer to the original H&P for additional details.  The current plan of care is to follow-up CT imaging, anticipate admission for UTI.  ----------------------------------------- 7:48 PM on 03/29/2024 ----------------------------------------- CT abdomen/pelvis shows bladder wall thickening consistent with UTI, no other acute finding noted.  CTA of head and neck does show PICA aneurysm.  On reassessment, patient states that she has similar headaches with recurrent vomiting episodes, low suspicion for [facility] at this time and she continues to have no focal neurologic deficits.  Findings discussed with Dr. Claudene of neurosurgery, who will arrange for outpatient follow-up.  Case discussed with hospitalist for admission for further management of UTI and vomiting.       Willo Dunnings, MD 03/29/24 1949

## 2024-03-29 NOTE — Assessment & Plan Note (Signed)
 Nicotine  patch

## 2024-03-29 NOTE — ED Provider Notes (Signed)
 [facility] Provider Note    Event Date/Time   First MD Initiated Contact with Patient 03/29/24 1331     (approximate)   History   Emesis   HPI  Aoki Wedemeyer is a 73 y.o. female with history of hypertension, COPD, CKD who comes in with concerns for nausea vomiting abdominal pain.  Patient reports feeling unwell this morning with multiple episodes of vomiting.  She reports having some intermittent vomiting since having a rotator cuff surgery months ago states that her health is just declined since then.  However she does go some days without any issues.  She does report having diarrhea, abdominal pain.  Not really sure she has a lot of urinary symptoms.  She does report history of anaphylaxis to penicillin.  She does report headache as well that she attributes to the vomiting that started this morning as well.  She reports it is a terrible headache that started this morning.     Physical Exam   Triage Vital Signs: ED Triage Vitals  Encounter Vitals Group     BP 03/29/24 1103 102/69     Girls Systolic BP Percentile --      Girls Diastolic BP Percentile --      Boys Systolic BP Percentile --      Boys Diastolic BP Percentile --      Pulse Rate 03/29/24 1103 75     Resp 03/29/24 1103 18     Temp 03/29/24 1103 99.9 F (37.7 C)     Temp Source 03/29/24 1103 Oral     SpO2 03/29/24 1103 96 %     Weight 03/29/24 1104 147 lb 14.9 oz (67.1 kg)     Height --      Head Circumference --      Peak Flow --      Pain Score 03/29/24 1104 5     Pain Loc --      Pain Education --      Exclude from Growth Chart --     Most recent vital signs: Vitals:   03/29/24 1103  BP: 102/69  Pulse: 75  Resp: 18  Temp: 99.9 F (37.7 C)  SpO2: 96%     General: Awake, no distress.  CV:  Good peripheral perfusion.  Resp:  Normal effort.  Abd:  No distention.  Tender throughout her abdomen. Other:  Moving all extremities.   ED Results / Procedures / Treatments    Labs (all labs ordered are listed, but only abnormal results are displayed) Labs Reviewed  COMPREHENSIVE METABOLIC PANEL WITH GFR - Abnormal; Notable for the following components:      Result Value   Glucose, Bld 134 (*)    All other components within normal limits  CBC - Abnormal; Notable for the following components:   WBC 19.3 (*)    Hemoglobin 11.6 (*)    HCT 34.5 (*)    All other components within normal limits  URINALYSIS, ROUTINE W REFLEX MICROSCOPIC - Abnormal; Notable for the following components:   Color, Urine YELLOW (*)    APPearance HAZY (*)    Hgb urine dipstick SMALL (*)    Protein, ur 30 (*)    Nitrite POSITIVE (*)    Leukocytes,Ua SMALL (*)    Bacteria, UA MANY (*)    All other components within normal limits  LIPASE, BLOOD     EKG  My interpretation of EKG:  Normal sinus rate of 59 without any ST elevation or T  wave versions, normal intervals  RADIOLOGY Pending    PROCEDURES:  Critical Care performed: No  Procedures   MEDICATIONS ORDERED IN ED: Medications  sodium chloride  0.9 % bolus 1,000 mL (has no administration in time range)  meropenem  (MERREM ) 1 g in sodium chloride  0.9 % 100 mL IVPB (1 g Intravenous New Bag/Given 03/29/24 1435)  ondansetron  (ZOFRAN ) injection 4 mg (4 mg Intravenous Given 03/29/24 1429)  acetaminophen  (TYLENOL ) tablet 1,000 mg (1,000 mg Oral Given 03/29/24 1420)     IMPRESSION / MDM / ASSESSMENT AND PLAN / ED COURSE  I reviewed the triage vital signs and the nursing notes.   Patient's presentation is most consistent with acute presentation with potential threat to life or bodily function.   Comes in with abdominal pain, severe headache in the setting of feeling ill this morning.  Patient was given some fluids, Tylenol , Zofran .  UA with concerns for UTI we will send for culture.  Lipase normal.  CMP reassuring CBC does show elevated white count  Given anaphylaxis to penicillin I will give a one-time dose of  meropenem .  CT imaging ordered to evaluate for intracranial hemorrhage, aneurysm given she does report severe headache, vomiting that started over 6 hours ago.  Reports the worst headache of her life although it given the diarrhea I do suspect that this is more likely related to some gastroenteritis but we will proceed with CT imaging of the head and abdomen pelvis.  Patient will be handed off to oncoming team pending CT imaging but given concerns for UTI with vomiting suspect patient may require admission for sepsis rule out given elevated white count.    The patient is on the cardiac monitor to evaluate for evidence of arrhythmia and/or significant heart rate changes.      FINAL CLINICAL IMPRESSION(S) / ED DIAGNOSES   Final diagnoses:  Acute cystitis without hematuria     Rx / DC Orders   ED Discharge Orders     None        Note:  This document was prepared using Dragon voice recognition software and may include unintentional dictation errors.   Ernest Ronal BRAVO, MD 03/29/24 1515

## 2024-03-29 NOTE — Assessment & Plan Note (Signed)
 Chronic pain Continue home meds as tolerated

## 2024-03-29 NOTE — ED Notes (Signed)
 CRITICAL VALUE STICKER  CRITICAL VALUE: Lactic Acid 2.6  DATE & TIME NOTIFIED: 3:12 PM  MD NOTIFIED: Dr. Ernest  TIME OF NOTIFICATION: 3:12 PM'

## 2024-03-29 NOTE — Progress Notes (Signed)
 CODE SEPSIS - PHARMACY COMMUNICATION  **Broad Spectrum Antibiotics should be administered within 1 hour of Sepsis diagnosis**  Time Code Sepsis Called/Page Received: 1351  Antibiotics Ordered: Meropenem   Time of 1st antibiotic administration: 1435  Additional action taken by pharmacy: N/A  Marolyn KATHEE Mare 03/29/2024  2:25 PM

## 2024-03-30 ENCOUNTER — Observation Stay: Admitting: Anesthesiology

## 2024-03-30 ENCOUNTER — Encounter: Payer: Self-pay | Admitting: Internal Medicine

## 2024-03-30 ENCOUNTER — Encounter
Admission: EM | Disposition: A | Discharge status: Home / Self Care | Payer: Self-pay | Source: Home / Self Care | Attending: Emergency Medicine

## 2024-03-30 DIAGNOSIS — K22719 Barrett's esophagus with dysplasia, unspecified: Secondary | ICD-10-CM | POA: Diagnosis not present

## 2024-03-30 DIAGNOSIS — R112 Nausea with vomiting, unspecified: Secondary | ICD-10-CM

## 2024-03-30 DIAGNOSIS — N3000 Acute cystitis without hematuria: Principal | ICD-10-CM

## 2024-03-30 DIAGNOSIS — I2510 Atherosclerotic heart disease of native coronary artery without angina pectoris: Secondary | ICD-10-CM

## 2024-03-30 DIAGNOSIS — I872 Venous insufficiency (chronic) (peripheral): Secondary | ICD-10-CM

## 2024-03-30 DIAGNOSIS — I10 Essential (primary) hypertension: Secondary | ICD-10-CM

## 2024-03-30 DIAGNOSIS — G4733 Obstructive sleep apnea (adult) (pediatric): Secondary | ICD-10-CM

## 2024-03-30 DIAGNOSIS — N390 Urinary tract infection, site not specified: Secondary | ICD-10-CM | POA: Diagnosis not present

## 2024-03-30 DIAGNOSIS — R1310 Dysphagia, unspecified: Secondary | ICD-10-CM

## 2024-03-30 DIAGNOSIS — F411 Generalized anxiety disorder: Secondary | ICD-10-CM

## 2024-03-30 DIAGNOSIS — R001 Bradycardia, unspecified: Secondary | ICD-10-CM

## 2024-03-30 DIAGNOSIS — R1110 Vomiting, unspecified: Secondary | ICD-10-CM

## 2024-03-30 DIAGNOSIS — F17209 Nicotine dependence, unspecified, with unspecified nicotine-induced disorders: Secondary | ICD-10-CM

## 2024-03-30 DIAGNOSIS — Z8719 Personal history of other diseases of the digestive system: Secondary | ICD-10-CM

## 2024-03-30 DIAGNOSIS — I671 Cerebral aneurysm, nonruptured: Secondary | ICD-10-CM

## 2024-03-30 DIAGNOSIS — G894 Chronic pain syndrome: Secondary | ICD-10-CM

## 2024-03-30 DIAGNOSIS — A419 Sepsis, unspecified organism: Secondary | ICD-10-CM | POA: Diagnosis not present

## 2024-03-30 DIAGNOSIS — Z79891 Long term (current) use of opiate analgesic: Secondary | ICD-10-CM

## 2024-03-30 HISTORY — PX: ESOPHAGOGASTRODUODENOSCOPY: SHX5428

## 2024-03-30 LAB — BASIC METABOLIC PANEL WITH GFR
Anion gap: 8 (ref 5–15)
BUN: 8 mg/dL (ref 8–23)
CO2: 25 mmol/L (ref 22–32)
Calcium: 8.7 mg/dL — ABNORMAL LOW (ref 8.9–10.3)
Chloride: 109 mmol/L (ref 98–111)
Creatinine, Ser: 0.78 mg/dL (ref 0.44–1.00)
GFR, Estimated: >60 mL/min (ref >60)
Glucose, Bld: 87 mg/dL (ref 70–99)
Potassium: 3.5 mmol/L (ref 3.5–5.1)
Sodium: 142 mmol/L (ref 135–145)

## 2024-03-30 LAB — CBC
HCT: 29.6 % — ABNORMAL LOW (ref 36.0–46.0)
Hemoglobin: 10.1 g/dL — ABNORMAL LOW (ref 12.0–15.0)
MCH: 30.1 pg (ref 26.0–34.0)
MCHC: 34.1 g/dL (ref 30.0–36.0)
MCV: 88.4 fL (ref 80.0–100.0)
Platelets: 217 K/uL (ref 150–400)
RBC: 3.35 MIL/uL — ABNORMAL LOW (ref 3.87–5.11)
RDW: 13.1 % (ref 11.5–15.5)
WBC: 10 K/uL (ref 4.0–10.5)
nRBC: 0 % (ref 0.0–0.2)

## 2024-03-30 LAB — LACTIC ACID, PLASMA: Lactic Acid, Venous: 1.4 mmol/L (ref 0.5–1.9)

## 2024-03-30 SURGERY — EGD (ESOPHAGOGASTRODUODENOSCOPY)
Anesthesia: General

## 2024-03-30 MED ORDER — PROPOFOL 10 MG/ML IV BOLUS
INTRAVENOUS | Status: DC | PRN
  Administered 2024-03-30: 20 mg via INTRAVENOUS
  Administered 2024-03-30: 70 mg via INTRAVENOUS

## 2024-03-30 MED ORDER — METHOCARBAMOL 500 MG PO TABS
500.0000 mg | ORAL_TABLET | Freq: Once | ORAL | Status: AC
  Administered 2024-03-30: 500 mg via ORAL
  Filled 2024-03-30: qty 1

## 2024-03-30 MED ORDER — DEXMEDETOMIDINE HCL IN NACL 80 MCG/20ML IV SOLN
INTRAVENOUS | Status: DC | PRN
  Administered 2024-03-30 (×2): 4 ug via INTRAVENOUS

## 2024-03-30 MED ORDER — ONDANSETRON HCL 4 MG/2ML IJ SOLN
INTRAMUSCULAR | Status: AC
  Filled 2024-03-30: qty 2

## 2024-03-30 MED ORDER — SODIUM CHLORIDE 0.9 % IV SOLN: INTRAVENOUS | Status: DC

## 2024-03-30 MED ORDER — ONDANSETRON HCL 4 MG/2ML IJ SOLN
INTRAMUSCULAR | Status: DC | PRN
  Administered 2024-03-30: 4 mg via INTRAVENOUS

## 2024-03-30 MED ORDER — PROCHLORPERAZINE EDISYLATE 10 MG/2ML IJ SOLN
10.0000 mg | Freq: Once | INTRAMUSCULAR | Status: AC
  Administered 2024-03-30: 10 mg via INTRAVENOUS
  Filled 2024-03-30: qty 2

## 2024-03-30 NOTE — Progress Notes (Signed)
 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 Arranging follow up with vascular neurosurgery as an outpatient.

## 2024-03-30 NOTE — Anesthesia Preprocedure Evaluation (Signed)
 Anesthesia Evaluation  Patient identified by MRN, date of birth, ID band Patient awake    Reviewed: Allergy & Precautions, NPO status , Patient's Chart, lab work & pertinent test results  Airway Mallampati: III  TM Distance: >3 FB Neck ROM: full    Dental  (+) Teeth Intact, Caps,    Pulmonary neg pulmonary ROS, sleep apnea , COPD,  COPD inhaler, Current Smoker   Pulmonary exam normal  + decreased breath sounds      Cardiovascular hypertension, Pt. on medications + CAD  negative cardio ROS Normal cardiovascular exam Rhythm:Regular Rate:Normal     Neuro/Psych  Headaches  Anxiety     negative neurological ROS  negative psych ROS   GI/Hepatic negative GI ROS, Neg liver ROS, hiatal hernia,GERD  Medicated,,  Endo/Other  negative endocrine ROS    Renal/GU negative Renal ROS  negative genitourinary   Musculoskeletal  (+) Arthritis ,    Abdominal  (+) + obese  Peds negative pediatric ROS (+)  Hematology negative hematology ROS (+)   Anesthesia Other Findings Past Medical History: No date: Anxiety No date: Arthritis     Comment:  back, hands, feet No date: Asthma 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 hospitalized.  No date: Hiatal hernia No date: Hyperlipidemia No date: Osteopenia No date: Presence of dental bridge     Comment:  permanent upper No date: Sciatica No date: Sleep apnea     Comment:  CPAP  Past Surgical History: No date: ABDOMINAL HYSTERECTOMY 2001: AUGMENTATION MAMMAPLASTY; Bilateral No date: BREAST SURGERY     Comment:  saline implants 06/10/2016: CARDIAC CATHETERIZATION; Left     Comment:  Procedure:  Left Heart Cath and Coronary Angiography;                Surgeon: Cara JONETTA Lovelace, MD;  Location: [facility] INVASIVE               CV LAB;  Service: Cardiovascular;  Laterality: Left; No date: ESOPHAGEAL DILATION 08/07/2015: ESOPHAGOGASTRODUODENOSCOPY (EGD) WITH PROPOFOL ; N/A     Comment:  Procedure: ESOPHAGOGASTRODUODENOSCOPY (EGD) WITH               PROPOFOL  with balloon dilation;  Surgeon: Rogelia Copping,               MD;  Location: [facility];  Service: Endoscopy;               Laterality: N/A; No date: EYE SURGERY No date: HAMMER TOE SURGERY; Right  BMI    Body Mass Index: 26.21 kg/m      Reproductive/Obstetrics negative OB ROS                              Anesthesia Physical Anesthesia Plan  ASA: 3  Anesthesia Plan: General   Post-op Pain Management:    Induction: Intravenous  PONV Risk Score and Plan: Propofol  infusion and TIVA  Airway Management Planned: Natural Airway and Nasal Cannula  Additional Equipment:   Intra-op Plan:   Post-operative Plan:   Informed Consent: I have reviewed the patients History and Physical, chart, labs and discussed the procedure including the risks, benefits  and alternatives for the proposed anesthesia with the patient or authorized representative who has indicated his/her understanding and acceptance.     Dental Advisory Given  Plan Discussed with: CRNA  Anesthesia Plan Comments:         Anesthesia Quick Evaluation

## 2024-03-30 NOTE — Progress Notes (Signed)
 This patient came in with 8 months of reported nausea vomiting with dysphagia.  The patient underwent an EGD today with a normal small bowel and normal stomach.  The patient's esophagus was also wide open without any stricture seen.  A 18 mm balloon was used to dilate the entire esophagus and was dredged up and down without the balloon touching the sides of most of the esophagus thereby meaning no stricture or narrowing was apparent.  I would consider the patient for other causes of her nausea and vomiting such as a central cause or anxiety. Nothing further to do from a GI point of view.

## 2024-03-30 NOTE — Progress Notes (Signed)
 Mobility Specialist - Progress Note     03/30/24 1100  Mobility  Activity Ambulated with assistance;Stood at bedside  Level of Assistance Standby assist, set-up cues, supervision of patient - no hands on  Assistive Device Front wheel walker  Distance Ambulated (ft) 200 ft  Range of Motion/Exercises Active  Activity Response Tolerated well  Mobility Referral Yes  Mobility visit 1 Mobility  Mobility Specialist Start Time (ACUTE ONLY) 1131  Mobility Specialist Stop Time (ACUTE ONLY) 1150  Mobility Specialist Time Calculation (min) (ACUTE ONLY) 19 min   Pt resting in bed on RA upon entry. Pt STS and ambulates to hallway around NS SBA. Pt returned to recliner and left with needs in reach, chair alarm activated.   Guido Rumble Mobility Specialist 03/30/24, 3:04 PM

## 2024-03-30 NOTE — Assessment & Plan Note (Deleted)
 Patient with history of esophageal

## 2024-03-30 NOTE — Anesthesia Postprocedure Evaluation (Signed)
 Anesthesia Post Note  Patient: Kim Farmer  Procedure(s) Performed: EGD (ESOPHAGOGASTRODUODENOSCOPY)  Patient location during evaluation: PACU Anesthesia Type: General Level of consciousness: awake and awake and alert Pain management: satisfactory to patient Vital Signs Assessment: post-procedure vital signs reviewed and stable Respiratory status: spontaneous breathing Cardiovascular status: stable Anesthetic complications: no   No notable events documented.   Last Vitals:  Vitals:   03/30/24 1017 03/30/24 1027  BP: (!) 98/41 (!) 109/47  Pulse: 63 65  Resp: 16 19  Temp:    SpO2: 96% 90%    Last Pain:  Vitals:   03/30/24 1027  TempSrc:   PainSc: 0-No pain                 VAN STAVEREN,Alexsus Papadopoulos

## 2024-03-30 NOTE — Consult Note (Signed)
 Rogelia Copping, MD [facility]  [address], [address] [place], [place] [address] Phone: [phone] Fax : [phone]  Consultation  Referring Provider:     Dr. Cleatus Primary Care Physician:  Epifanio Alm SQUIBB, MD Primary Gastroenterologist:  Dr. Maryruth         Reason for Consultation:     8 months of vomiting  Date of Admission:  03/29/2024 Date of Consultation:  03/30/2024         HPI:   Kim Farmer is a 73 y.o. female who has been seen by Dr. Maryruth in the past with his assessment back in April being small intestinal bacterial overgrowth with treatment of this with antibiotics.  The patient had been consulted for dyspepsia and a history of increased vomiting with increasing constipation.  The patient had reported a decreased appetite and no weight loss.  The stated reason for the consultation was hematemesis at that time.  The patient's previous labs showed her to have mild normocytic anemia.  Her previous endoscopies included Barrett's esophagus in November 2022.    The patient patient had blood work recently that showed:  Component     Latest Ref Rng 11/25/2020 03/29/2024 03/30/2024  Hemoglobin     12.0 - 15.0 g/dL 89.3 (L)  88.3 (L)  89.8 (L)   HCT     36.0 - 46.0 % 31.4 (L)  34.5 (L)  29.6 (L)      The patient had a CT scan of the abdomen yesterday that showed:  IMPRESSION: 1. Interval minimal diffuse bladder wall thickening and enhancement. This can be seen with cystitis. 2. Mild progression of pancreatic ductal dilatation in the head of the pancreas, currently measuring 6.4 mm in diameter. This is of uncertain significance in the absence of a visualized mass. This is most likely related to progressive atrophy of those portions of the pancreas. 3. 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 4. Stable mild cardiomegaly. 5. Minimal pericardial effusion.  The  patient reports that she started to have nausea and vomiting with a shoulder repair she had many months ago.  She states that she has also been dilated 8 times with previous EGDs for esophageal strictures.  The patient now reports that she has been having recurrent nausea and vomiting.  Past Medical History:  Diagnosis Date   Anxiety    Arthritis    back, hands, feet   Asthma    Barrett esophagus    Bulging lumbar disc    Chronic pain    COPD (chronic obstructive pulmonary disease) (HCC)    Degenerative disc disease, cervical    limited left turn   Depression    GERD (gastroesophageal reflux disease)    Hepatitis    age 47 or 89. hospitalized.    Hiatal hernia    Hyperlipidemia    Osteopenia    Presence of dental bridge    permanent upper   Sciatica    Sleep apnea    CPAP    Past Surgical History:  Procedure Laterality Date   ABDOMINAL HYSTERECTOMY     AUGMENTATION MAMMAPLASTY Bilateral 2001   BREAST SURGERY     saline implants   CARDIAC CATHETERIZATION Left 06/10/2016   Procedure: Left Heart Cath and Coronary Angiography;  Surgeon: Cara JONETTA Lovelace, MD;  Location: [facility] INVASIVE [facility];  Service: Cardiovascular;  Laterality: Left;   ESOPHAGEAL  DILATION     ESOPHAGOGASTRODUODENOSCOPY (EGD) WITH PROPOFOL  N/A 08/07/2015   Procedure: ESOPHAGOGASTRODUODENOSCOPY (EGD) WITH PROPOFOL  with balloon dilation;  Surgeon: Rogelia Copping, MD;  Location: [facility];  Service: Endoscopy;  Laterality: N/A;   EYE SURGERY     HAMMER TOE SURGERY Right     Prior to Admission medications   Medication Sig Start Date End Date Taking? Authorizing Provider  albuterol  (PROAIR  HFA) 108 (90 BASE) MCG/ACT inhaler Inhale 1 puff into the lungs every 6 (six) hours as needed.  05/23/15  Yes [provider]  atorvastatin  (LIPITOR) 40 MG tablet TAKE 1 TABLET(40 MG) BY MOUTH DAILY 02/27/16  Yes Sowles, Krichna, MD  busPIRone  (BUSPAR ) 5 MG tablet Take 5 mg by mouth 3 (three) times daily.  03/27/24  Yes [provider]  fluticasone  (FLONASE ) 50 MCG/ACT nasal spray SHAKE LIQUID AND USE 2 SPRAYS IN EACH NOSTRIL DAILY 06/24/16  Yes Sowles, Krichna, MD  furosemide (LASIX) 20 MG tablet Take 20 mg by mouth daily.   Yes [provider]  lidocaine  (LIDODERM ) 5 % Place 1 patch onto the skin daily. 01/26/24  Yes [provider]  losartan (COZAAR) 100 MG tablet Take 100 mg by mouth daily.   Yes [provider]  memantine  (NAMENDA ) 5 MG tablet Take 5 mg by mouth 2 (two) times daily.   Yes [provider]  mupirocin ointment (BACTROBAN) 2 % Apply 1 Application topically 3 (three) times daily. 02/21/24  Yes [provider]  naloxone (NARCAN) nasal spray 4 mg/0.1 mL Place 4 mg into the nose once. 09/22/23  Yes [provider]  omeprazole (PRILOSEC) 40 MG capsule Take 40 mg by mouth 2 (two) times daily before a meal. 03/22/24  Yes [provider]  ondansetron  (ZOFRAN -ODT) 8 MG disintegrating tablet Take 8 mg by mouth 3 (three) times daily.   Yes [provider]  Oxycodone  HCl 10 MG TABS Take 10 mg by mouth every 4 (four) hours as needed (Pain). 03/03/24 04/02/24 Yes [provider]  potassium chloride  SA (KLOR-CON  M) 20 MEQ tablet Take 20 mEq by mouth daily.   Yes [provider]  RESTASIS 0.05 % ophthalmic emulsion Place 1 drop into both eyes 2 (two) times daily.  07/17/15  Yes [provider]  roflumilast  (DALIRESP ) 500 MCG TABS tablet Take 500 mcg by mouth daily.   Yes [provider]  tamsulosin  (FLOMAX ) 0.4 MG CAPS capsule Take 0.8 mg by mouth daily.   Yes [provider]  TRELEGY ELLIPTA 100-62.5-25 MCG/ACT AEPB Inhale 1 puff into the lungs daily.   Yes [provider]  triamcinolone  cream (KENALOG ) 0.1 % Apply 1 Application topically 2 (two) times daily. 11/14/23  Yes [provider]  TRINTELLIX  20 MG TABS tablet Take 20 mg by mouth daily.   Yes [provider]  valACYclovir (VALTREX) 500 MG tablet Take 500 mg by mouth 2 (two) times daily.   Yes [provider]  varenicline  (CHANTIX ) 1 MG tablet Take 1 mg by mouth 2 (two) times daily.   Yes [provider]  venlafaxine XR (EFFEXOR-XR) 37.5 MG 24 hr capsule Take 37.5 mg by mouth 2 (two) times daily.   Yes [provider]  XIFAXAN 550 MG TABS tablet Take 550 mg by mouth 2 (two) times daily. 12/21/23  Yes [provider]  ALPRAZolam SHEFFIELD) 1 MG tablet Take 1 mg by mouth 3 (three) times daily. Patient not taking: Reported on 03/29/2024    [provider]  aspirin  EC 81 MG tablet Take 1 tablet (81 mg total) by mouth daily. Patient not taking: Reported on 03/29/2024 11/04/15   Sowles, Krichna, MD  famotidine (PEPCID) 20 MG tablet Take 20 mg by mouth 2 (two) times daily. Patient not taking: Reported on 03/29/2024    [provider]  oxybutynin (DITROPAN-XL) 5 MG 24 hr tablet Take 5 mg by mouth daily. Patient not taking: Reported on 03/29/2024    [provider]    Family History  Problem Relation Age of Onset   Diabetes Mother    Hypertension Mother    Diabetes Father    Hypertension Father    COPD Sister    Diabetes Sister    Emphysema Sister    Breast cancer Maternal Aunt    Breast cancer Maternal Grandmother      Social History   Tobacco Use   Smoking status: Every Day    Current packs/day: 0.50    Average packs/day: 0.5 packs/day for 48.8 years (24.4 ttl pk-yrs)    Types: Cigarettes    Start date: 06/16/1975   Smokeless tobacco: Never  Substance Use Topics   Alcohol use: Yes    Alcohol/week: 0.0 standard drinks of alcohol    Comment: 2 beers today - daughter states the patient drinks regularly and heavily   Drug use: No    Allergies as of 03/29/2024 - Review Complete 03/29/2024  Allergen Reaction Noted   Penicillins Shortness Of Breath 02/06/2015   Sulfa antibiotics Other (See Comments) 02/06/2015     Review of Systems:    All systems reviewed and negative except where noted in HPI.   Physical Exam:  Vital signs in last 24 hours: Temp:  [98.7 F (37.1 C)-100.9 F (38.3 C)] 98.7 F (37.1 C) (08/15 0513) Pulse Rate:  [54-77] 57 (08/15 0513) Resp:  [11-20] 17 (08/15 0513) BP: (102-148)/(51-85) 117/52 (08/15 0513) SpO2:  [92 %-100 %] 98 % (08/15 0513) Weight:  [67.1 kg] 67.1 kg (08/14 1104) Last BM Date : 03/29/24 General:   Pleasant, cooperative in NAD Head:  Normocephalic and atraumatic. Eyes:   No icterus.   Conjunctiva pink. PERRLA. Ears:  Normal auditory acuity. Neck:  Supple; no masses or thyroidomegaly Lungs: Respirations even and unlabored. Lungs clear to auscultation bilaterally.   No wheezes, crackles, or rhonchi.  Heart:  Regular rate and rhythm;  Without murmur, clicks, rubs or gallops Abdomen:  Soft, nondistended, nontender. Normal bowel sounds. No appreciable masses or hepatomegaly.  No rebound or guarding.  Rectal:  Not performed. Msk:  Symmetrical without gross deformities.   Extremities:  Without edema, cyanosis or clubbing. Neurologic:  Alert and oriented x3;  grossly normal neurologically. Skin:  Intact without significant lesions or rashes. Cervical Nodes:  No significant cervical adenopathy. Psych:  Alert and cooperative. Normal affect.  LAB RESULTS: Recent Labs    03/29/24 1108 03/30/24 0456  WBC 19.3* 10.0  HGB 11.6* 10.1*  HCT 34.5* 29.6*  PLT 302 217   BMET Recent Labs    03/29/24 1108 03/30/24 0456  NA 138 142  K 3.6 3.5  CL 101 109  CO2 26 25  GLUCOSE 134* 87  BUN 10 8  CREATININE 0.96 0.78  CALCIUM  9.5 8.7*   LFT Recent Labs    03/29/24 1108  PROT 7.1  ALBUMIN 4.1  AST 29  ALT 15  ALKPHOS 101  BILITOT 0.7   PT/INR No results for input(s): LABPROT, INR in the last 72 hours.  STUDIES: CT ANGIO HEAD NECK  W WO CM Result Date: 03/29/2024 EXAM: CTA HEAD AND NECK WITH AND WITHOUT 03/29/2024 05:30:30 PM TECHNIQUE: CTA  of the head and neck was performed with and without the administration of intravenous contrast. Multiplanar 2D and/or 3D reformatted images are provided for review. Automated exposure control, iterative reconstruction, and/or weight based adjustment of the mA/kV was utilized to reduce the radiation dose to as low as reasonably achievable. Stenosis of the internal carotid arteries measured using NASCET criteria. COMPARISON: None available CLINICAL HISTORY: Headache, nausea, and vomiting. Patient has a history of surgery in January and reports emesis 5 out of 7 days every week since, with worsening symptoms today. Also reports diarrhea and headache. Patient took 2 ODT zofran  prior to EMS arrival. FINDINGS: CTA NECK: AORTIC ARCH AND ARCH VESSELS: Atherosclerosis of the aortic arch. No dissection or arterial injury. No significant stenosis of the brachiocephalic or subclavian arteries. CERVICAL CAROTID ARTERIES: Mild tortuosity of the proximal right common carotid artery with retropharyngeal course of the right common carotid artery and proximal right cervical ICA. No hemodynamically significant stenosis of the right cervical carotid artery. Additional tortuosity of the distal right cervical ICA. Retropharyngeal course of the left common carotid artery. No hemodynamically significant stenosis of the left cervical carotid artery. Tortuosity of the distal left cervical ICA. CERVICAL VERTEBRAL ARTERIES: The vertebral arteries are patent from the origins to the vertebrobasilar confluence. There is tortuosity of the left V1 segment. The V4 segment of the left vertebral artery is diminished in caliber after the origin of the left PICA. LUNGS AND MEDIASTINUM: Unremarkable. SOFT TISSUES: No acute abnormality. BONES: Degenerative changes in the visualized spine. There is partial fusion of the C4 through C6 vertebral bodies. CTA HEAD: ANTERIOR CIRCULATION: Atherosclerosis of the carotid siphons. There is mild stenosis of the  cavernous and supraclinoid internal carotid arteries. The middle cerebral arteries are patent bilaterally. The anterior cerebral arteries are patent bilaterally. POSTERIOR CIRCULATION: The basilar artery is patent and is primarily supplied via the right vertebral artery. The posterior cerebral arteries are patent bilaterally. The superior cerebellar arteries are patent bilaterally. There is a 5 x 4 x 4 mm saccular outpouching along the mid portion of the left PICA compatible with aneurysm seen on series 14 image 133 and series 15 image 143. OTHER: No dural venous sinus thrombosis on this non-dedicated study. Nonspecific hypoattenuation in the periventricular and subcortical white matter, most likely representing chronic small vessel disease. Mild parenchymal volume loss. Moderate right mastoid effusion. Degenerative changes of the mandibular condyles, greater on the right. IMPRESSION: 1. No large vessel occlusion or hemodynamically significant stenosis in the head or neck. 2. 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 3. Mild stenosis of the cavernous and supraclinoid internal carotid arteries. 4. Chronic microvascular ischemic changes and mild parenchymal volume loss. Electronically signed by: Donnice Mania MD 03/29/2024 06:53 PM EDT RP Workstation: [id number]   CT ABDOMEN PELVIS W CONTRAST Result Date: 03/29/2024 CLINICAL DATA:  Acute, non localized abdominal pain. Vomiting 5/7 days every week since surgery in January 2025. Diarrhea. EXAM: CT ABDOMEN AND PELVIS WITH CONTRAST TECHNIQUE: Multidetector CT imaging of the abdomen and pelvis was performed using the standard protocol following bolus administration of intravenous contrast. RADIATION DOSE REDUCTION: This exam was performed according to the departmental dose-optimization program which includes automated exposure control, adjustment of the mA and/or kV according to patient size and/or use of iterative reconstruction technique.  CONTRAST:  OMNIPAQUE  IOHEXOL  350 MG/ML SOLN  COMPARISON:  05/18/2015 FINDINGS: Lower chest: Stable mildly enlarged heart. Minimal pericardial effusion. Minimal linear scarring at the left lung base. Hepatobiliary: No focal liver abnormality is seen. No gallstones, gallbladder wall thickening, or biliary dilatation. Previously demonstrated lateral segment left lobe low-density masses are fewer and smaller, currently with appearances suggesting cysts. Pancreas: The pancreatic duct in the head of the pancreas remains dilated with mild progression, currently measuring 6.4 mm in diameter. This returns to normal size in the tail and distal body of the pancreas. No obstructing mass visualized. Spleen: Normal in size without focal abnormality. Adrenals/Urinary Tract: Interval 7 mm oval low-density mass in the mid to upper left kidney medially. This is too small to accurately characterize and has an appearance suggesting a small complicated cyst. Unremarkable adrenal glands, ureters and urinary bladder with the exception of interval minimal diffuse bladder wall thickening and enhancement. Stomach/Bowel: Unremarkable stomach, small bowel and colon. The appendix is not visualized. No secondary signs of appendicitis. Vascular/Lymphatic: Atheromatous arterial calcifications without aneurysm. No enlarged lymph nodes. Reproductive: Status post hysterectomy. No adnexal masses. Other: No abdominal wall hernia or abnormality. No abdominopelvic ascites. 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 Moderate bilateral hip degenerative changes. Mild thoracolumbar spine degenerative changes. IMPRESSION: 1. Interval minimal diffuse bladder wall thickening and enhancement. This can be seen with cystitis. 2. Mild progression of pancreatic ductal dilatation in the head of the pancreas, currently measuring 6.4 mm in  diameter. This is of uncertain significance in the absence of a visualized mass. This is most likely related to progressive atrophy of those portions of the pancreas. 3. 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 4. Stable mild cardiomegaly. 5. Minimal pericardial effusion. Electronically Signed   By: Elspeth Bathe M.D.   On: 03/29/2024 18:44      Impression / Plan:   Assessment: Principal Problem:   Sepsis secondary to UTI [place]) Active Problems:   Tobacco use disorder, continuous   Chronic pain   OSA (obstructive sleep apnea)   History of esophageal stricture   Coronary artery calcification seen on CT scan   Intractable vomiting   Venous insufficiency (chronic) (peripheral)   Essential hypertension   Sinus bradycardia   Generalized anxiety disorder   Barrett esophagus   Headache   Brain aneurysm   Chronic prescription opiate use   Kim Farmer is a 73 y.o. y/o female with a month of nausea and vomiting.  The patient has a history of Barrett's esophagus.  She is now being admitted with a UTI and possible sepsis with her report of persistent vomiting.  There is no report of any abdominal pain.  Plan:  The patient will be set up for an EGD with possible dilation for today.  The patient's primary GI doctor is at [facility] where she gets all of her care.  The patient has been explained the risks and benefits of the procedure and agrees to proceed with the procedure.  Thank you for involving me in the care of this patient.      LOS: 0 days   Rogelia Copping, MD, MD. NOLIA 03/30/2024, 7:20 AM,  Pager [phone] 7am-5pm  Check AMION for 5pm -7am coverage and on weekends   Note: This dictation was prepared with Dragon dictation along with smaller phrase technology.  Any transcriptional errors that result from this process are unintentional.

## 2024-03-30 NOTE — Op Note (Signed)
 [facility] Patient Name: Kim Farmer Procedure Date: 03/30/2024 9:41 AM MRN: [id number] Account #: [id number] Date of Birth: June 22, 1951 Admit Type: Inpatient Age: 73 Room: [facility] ENDO ROOM 2 Gender: Female Note Status: Finalized Instrument Name: [facility] GI Scope [id number] Procedure:             Upper GI endoscopy Indications:           Dysphagia, Nausea with vomiting Providers:             Rogelia Copping MD, MD Medicines:             Propofol  per Anesthesia Complications:         No immediate complications. Procedure:             Pre-Anesthesia Assessment:                        - Prior to the procedure, a History and Physical was                         performed, and patient medications and allergies were                         reviewed. The patient's tolerance of previous                         anesthesia was also reviewed. The risks and benefits                         of the procedure and the sedation options and risks                         were discussed with the patient. All questions were                         answered, and informed consent was obtained. Prior                         Anticoagulants: The patient has taken no anticoagulant                         or antiplatelet agents. ASA Grade Assessment: II - A                         patient with mild systemic disease. After reviewing                         the risks and benefits, the patient was deemed in                         satisfactory condition to undergo the procedure.                        After obtaining informed consent, the endoscope was                         passed under direct vision. Throughout the procedure,  the patient's blood pressure, pulse, and oxygen                         saturations were monitored continuously. The Endoscope                         was introduced through the mouth, and advanced to the                         second  part of duodenum. The upper GI endoscopy was                         accomplished without difficulty. The patient tolerated                         the procedure well. Findings:      The examined esophagus was normal. A TTS dilator was passed through the       scope. Dilation with a 15-16.5-18 mm balloon dilator was performed to 18       mm. The dilation site was examined following endoscope reinsertion and       showed no change.      The stomach was normal.      The examined duodenum was normal. Impression:            - Normal esophagus. Dilated.                        - Normal stomach.                        - Normal examined duodenum.                        - No specimens collected. Recommendation:        - Return patient to hospital ward for ongoing care.                        - Resume previous diet.                        - Continue present medications.                        - The patient should follow up with her GI doctors at                         [facility]. Procedure Code(s):     --- Professional ---                        [phone], Esophagogastroduodenoscopy, flexible,                         transoral; with transendoscopic balloon dilation of                         esophagus (less than 30 mm diameter) Diagnosis Code(s):     --- Professional ---                        R13.10, Dysphagia, unspecified  R11.2, Nausea with vomiting, unspecified CPT copyright 2022 American Medical Association. All rights reserved. The codes documented in this report are preliminary and upon coder review may  be revised to meet current compliance requirements. Rogelia Copping MD, MD 03/30/2024 10:13:13 AM This report has been signed electronically. Number of Addenda: 0 Note Initiated On: 03/30/2024 9:41 AM Estimated Blood Loss:  Estimated blood loss: none.      [facility]

## 2024-03-30 NOTE — Plan of Care (Signed)
 Patient admitted to unit for vomiting.  Patient remains free from any noted signs of acute GI distress upon arrival.  Arrives to unit via stretcher.  Patient noted to be A/OX 4.  RN able to perform general assessment.  Skin remains intact.  IVF's and telemetry initiated per MD order.  Patient noted to tolerate current IVF without difficulty.  RN able to review with patient on admission information.  Patient has verbalized understanding of information reviewed.  Verbally agrees with current hospitalization.

## 2024-03-30 NOTE — Progress Notes (Signed)
 Progress Note   Patient: Kim Farmer FMW:[id number] DOB: January 17, 1951 DOA: 03/29/2024     0 DOS: the patient was seen and examined on 03/30/2024   Brief hospital course: Taken from H&P.  Kim Farmer is a 73 y.o. female with medical history significant for non-obstructive coronary artery disease, chronic dyspnea, chronic venous stasis, COPD, HTN, OSA(), chronic pain syndrome on chronic opiates history of Barrett's esophagus/s/p esophageal dilatation(scheduled for EGD in November), being admitted for UTI with possible sepsis after presenting with vomiting and diarrhea that started on the day of arrival.   On presentation patient has low-grade fever, borderline soft blood pressure, labs with leukocytosis at 19,000, lactic acidosis at 2.6>> 2.0, UA concerning for UTI, rest of the labs at baseline.  CT abdomen and pelvis showed diffuse bladder wall thickening consistent with cystitis.  Also showed progression of pancreatic ductal dilatation of uncertain significance-please refer to full report CT angio head and neck notable for 5 mm saccular aneurysm left PICA among other nonacute findings   The ED provider spoke with neurosurgeon Dr. Claudene who advised no intervention needed for the aneurysm and patient can follow-up as outpatient.  She was given a migraine cocktail.  Patient received IV fluid and a dose of meropenem  followed by started on Levaquin  as due to penicillin allergy causing shortness of breath allergy causing shortness of breath.  GI was also consulted.  Due to intractable vomiting with her history of esophageal stricture and Barrett's esophagus.  8/15: Afebrile with stable vitals this morning, preliminary blood cultures negative, improving leukocytosis, decreasing hemoglobin but also line decreased likely some dilutional effect.  Urine cultures pending.  EGD today with no significant esophageal narrowing but it was dilated anyway.  Some improvement in nausea and vomiting which seems  chronic.  Would like to spend another night to see if she can tolerate diet.  Assessment and Plan: * Sepsis secondary to UTI (HCC) UA consistent with UTI and CT showing possible cystitis Sepsis criteria include fever, leukocytosis and lactic acidosis to 2.6-which has been resolved now Urine cultures with gram-negative rod Continue Levaquin  Levaquin  (given shortness of breath with penicillins) Follow cultures  Intractable vomiting History of esophageal stricture and Barrett's esophagus EGD today was negative for any esophageal narrowing but dilatation was tried anyway to help.  Seems like having chronic issue with nausea and vomiting. Protonix  and antiemetics Started on diet  Headache Brain aneurysm on CTA head and neck 03/29/2024 Treated with a migraine cocktail in the ED Neurosurgery was consulted from the ED-outpatient follow-up recommended no acute intervention needed  Chronic prescription opiate use Chronic pain Continue home meds as tolerated  Sinus bradycardia Noted to be bradycardia in the 50s in the ED, asymptomatic Not on rate control agents Continue to monitor  Venous insufficiency (chronic) (peripheral) No acute issues Previously evaluated by cardiology with no evidence of CHF-had a normal echo in 2024 Continue furosemide  Coronary artery calcification seen on CT scan Nonobstructive CAD Continue aspirin , atorvastatin , losartan  OSA (obstructive sleep apnea) CPAP intolerant  Generalized anxiety disorder Continue home buspirone  and other meds, pending med rec  Essential hypertension BP was soft so we will cautiously resume home meds  Tobacco use disorder, continuous Nicotine  patch   Subjective: Patient was seen after the EGD today.  Stating that she was feeling little hungry and no current vomiting.  She has not eaten anything for the past 3 days and would like to try some food to see her tolerance before going home.  Patient is having  nausea and vomiting  for the past many months.  Physical Exam: Vitals:   03/30/24 1017 03/30/24 1027 03/30/24 1250 03/30/24 1536  BP: (!) 98/41 (!) 109/47 135/63 112/61  Pulse: 63 65 63 65  Resp: 16 19 19 16   Temp:   97.9 F (36.6 C) 99.1 F (37.3 C)  TempSrc:      SpO2: 96% 90% 100% 97%  Weight:      Height:       General.  Frail elderly lady, in no acute distress. Pulmonary.  Lungs clear bilaterally, normal respiratory effort. CV.  Regular rate and rhythm, no JVD, rub or murmur. Abdomen.  Soft, nontender, nondistended, BS positive. CNS.  Alert and oriented .  No focal neurologic deficit. Extremities.  No edema, no cyanosis, pulses intact and symmetrical. Psychiatry.  Judgment and insight appears normal.   Data Reviewed: Prior data reviewed  Family Communication: Discussed with patient  Disposition: Status is: Observation The patient will require care spanning > 2 midnights and should be moved to inpatient because: Severity of illness  Planned Discharge Destination: Home  Time spent: 50 minutes  This record has been created using Conservation officer, historic buildings. Errors have been sought and corrected,but may not always be located. Such creation errors do not reflect on the standard of care.   Author: Amaryllis Dare, MD 03/30/2024 4:03 PM  For on call review [email or web address].

## 2024-03-30 NOTE — Transfer of Care (Signed)
 Immediate Anesthesia Transfer of Care Note  Patient: Kim Farmer  Procedure(s) Performed: EGD (ESOPHAGOGASTRODUODENOSCOPY)  Patient Location: PACU and Endoscopy Unit  Anesthesia Type:General  Level of Consciousness: drowsy and patient cooperative  Airway & Oxygen Therapy: Patient Spontanous Breathing  Post-op Assessment: Report given to RN and Post -op Vital signs reviewed and stable  Post vital signs: Reviewed and stable  Last Vitals:  Vitals Value Taken Time  BP 93/43 03/30/24 10:07  Temp 36.1 C 03/30/24 10:07  Pulse 67 03/30/24 10:09  Resp 23 03/30/24 10:09  SpO2 88 % 03/30/24 10:09  Vitals shown include unfiled device data.  Last Pain:  Vitals:   03/30/24 1007  TempSrc: Rectal  PainSc: 0-No pain         Complications: No notable events documented.

## 2024-03-30 NOTE — Hospital Course (Addendum)
 Taken from H&P.  Kim Farmer is a 73 y.o. female with medical history significant for non-obstructive coronary artery disease, chronic dyspnea, chronic venous stasis, COPD, HTN, OSA(), chronic pain syndrome on chronic opiates history of Barrett's esophagus/s/p esophageal dilatation(scheduled for EGD in November), being admitted for UTI with possible sepsis after presenting with vomiting and diarrhea that started on the day of arrival.   On presentation patient has low-grade fever, borderline soft blood pressure, labs with leukocytosis at 19,000, lactic acidosis at 2.6>> 2.0, UA concerning for UTI, rest of the labs at baseline.  CT abdomen and pelvis showed diffuse bladder wall thickening consistent with cystitis.  Also showed progression of pancreatic ductal dilatation of uncertain significance-please refer to full report CT angio head and neck notable for 5 mm saccular aneurysm left PICA among other nonacute findings   The ED provider spoke with neurosurgeon Dr. Claudene who advised no intervention needed for the aneurysm and patient can follow-up as outpatient.  She was given a migraine cocktail.  Patient received IV fluid and a dose of meropenem  followed by started on Levaquin  as due to penicillin allergy causing shortness of breath allergy causing shortness of breath.  GI was also consulted.  Due to intractable vomiting with her history of esophageal stricture and Barrett's esophagus.  8/15: Afebrile with stable vitals this morning, preliminary blood cultures negative, improving leukocytosis, decreasing hemoglobin but also line decreased likely some dilutional effect.  Urine cultures pending.  EGD today with no significant esophageal narrowing but it was dilated anyway.  Some improvement in nausea and vomiting which seems chronic.  Would like to spend another night to see if she can tolerate diet.  8/16: Patient became febrile up to 103, single episode around 3 AM.  Urine cultures with  Klebsiella pneumonia-antibiotics switched with Cipro .  She wants to spend another night to make sure that she does not have another episode of fever.  Tolerating diet.

## 2024-03-31 DIAGNOSIS — N3000 Acute cystitis without hematuria: Secondary | ICD-10-CM | POA: Diagnosis not present

## 2024-03-31 DIAGNOSIS — Z8719 Personal history of other diseases of the digestive system: Secondary | ICD-10-CM | POA: Diagnosis not present

## 2024-03-31 DIAGNOSIS — A419 Sepsis, unspecified organism: Secondary | ICD-10-CM | POA: Diagnosis not present

## 2024-03-31 DIAGNOSIS — K22719 Barrett's esophagus with dysplasia, unspecified: Secondary | ICD-10-CM | POA: Diagnosis not present

## 2024-03-31 MED ORDER — CIPROFLOXACIN HCL 500 MG PO TABS
500.0000 mg | ORAL_TABLET | Freq: Two times a day (BID) | ORAL | Status: DC
  Administered 2024-03-31 – 2024-04-01 (×2): 500 mg via ORAL
  Filled 2024-03-31 (×2): qty 1

## 2024-03-31 NOTE — Progress Notes (Signed)
 Progress Note   Patient: Kim Farmer FMW:[id number] DOB: [birth year] DOA: 03/29/2024     0 DOS: the patient was seen and examined on 03/31/2024   Brief hospital course: Taken from H&P.  Marialuisa Basara is a 73 y.o. female with medical history significant for non-obstructive coronary artery disease, chronic dyspnea, chronic venous stasis, COPD, HTN, OSA(), chronic pain syndrome on chronic opiates history of Barrett's esophagus/s/p esophageal dilatation(scheduled for EGD in November), being admitted for UTI with possible sepsis after presenting with vomiting and diarrhea that started on the day of arrival.   On presentation patient has low-grade fever, borderline soft blood pressure, labs with leukocytosis at 19,000, lactic acidosis at 2.6>> 2.0, UA concerning for UTI, rest of the labs at baseline.  CT abdomen and pelvis showed diffuse bladder wall thickening consistent with cystitis.  Also showed progression of pancreatic ductal dilatation of uncertain significance-please refer to full report CT angio head and neck notable for 5 mm saccular aneurysm left PICA among other nonacute findings   The ED provider spoke with neurosurgeon Dr. Claudene who advised no intervention needed for the aneurysm and patient can follow-up as outpatient.  She was given a migraine cocktail.  Patient received IV fluid and a dose of meropenem  followed by started on Levaquin  as due to penicillin allergy causing shortness of breath allergy causing shortness of breath.  GI was also consulted.  Due to intractable vomiting with her history of esophageal stricture and Barrett's esophagus.  8/15: Afebrile with stable vitals this morning, preliminary blood cultures negative, improving leukocytosis, decreasing hemoglobin but also line decreased likely some dilutional effect.  Urine cultures pending.  EGD today with no significant esophageal narrowing but it was dilated anyway.  Some improvement in nausea and vomiting which seems  chronic.  Would like to spend another night to see if she can tolerate diet.  8/16: Patient became febrile up to 103, single episode around 3 AM.  Urine cultures with Klebsiella pneumonia-antibiotics switched with Cipro .  She wants to spend another night to make sure that she does not have another episode of fever.  Tolerating diet.  Assessment and Plan: * Sepsis secondary to UTI (HCC) UA consistent with UTI and CT showing possible cystitis Sepsis criteria include fever, leukocytosis and lactic acidosis to 2.6-which has been resolved now Urine cultures with gram-negative rod Continue Levaquin  Levaquin  (given shortness of breath with penicillins) Follow cultures  Intractable vomiting History of esophageal stricture and Barrett's esophagus EGD today was negative for any esophageal narrowing but dilatation was tried anyway to help.  Seems like having chronic issue with nausea and vomiting. Protonix  and antiemetics Started on diet  Headache Brain aneurysm on CTA head and neck 03/29/2024 Treated with a migraine cocktail in the ED Neurosurgery was consulted from the ED-outpatient follow-up recommended no acute intervention needed  Chronic prescription opiate use Chronic pain Continue home meds as tolerated  Sinus bradycardia Noted to be bradycardia in the 50s in the ED, asymptomatic Not on rate control agents Continue to monitor  Venous insufficiency (chronic) (peripheral) No acute issues Previously evaluated by cardiology with no evidence of CHF-had a normal echo in 2024 Continue furosemide  Coronary artery calcification seen on CT scan Nonobstructive CAD Continue aspirin , atorvastatin , losartan  OSA (obstructive sleep apnea) CPAP intolerant  Generalized anxiety disorder Continue home buspirone  and other meds, pending med rec  Essential hypertension BP was soft so we will cautiously resume home meds  Tobacco use disorder, continuous Nicotine  patch   Subjective: Patient  was still  feeling weak and had 1 episode of becoming febrile at 103.  No chills.  Physical Exam: Vitals:   03/30/24 2347 03/31/24 0345 03/31/24 0703 03/31/24 0832  BP: (!) 123/56 (!) 138/53  127/68  Pulse: 73 79  66  Resp:    16  Temp: 100 F (37.8 C) (!) 103.1 F (39.5 C) 98.1 F (36.7 C) 98.5 F (36.9 C)  TempSrc: Oral Oral Oral Oral  SpO2: 98% 98%  94%  Weight:      Height:       General.  Frail elderly lady, in no acute distress. Pulmonary.  Lungs clear bilaterally, normal respiratory effort. CV.  Regular rate and rhythm, no JVD, rub or murmur. Abdomen.  Soft, nontender, nondistended, BS positive. CNS.  Alert and oriented .  No focal neurologic deficit. Extremities.  No edema, no cyanosis, pulses intact and symmetrical. Psychiatry.  Judgment and insight appears normal.    Data Reviewed: Prior data reviewed  Family Communication: Discussed with patient  Disposition: Status is: Observation The patient will require care spanning > 2 midnights and should be moved to inpatient because: Severity of illness  Planned Discharge Destination: Home  Time spent: 45 minutes  This record has been created using Conservation officer, historic buildings. Errors have been sought and corrected,but may not always be located. Such creation errors do not reflect on the standard of care.   Author: Amaryllis Dare, MD 03/31/2024 4:31 PM  For on call review [email or web address].

## 2024-03-31 NOTE — Progress Notes (Signed)
 Mobility Specialist - Progress Note    03/31/24 1504  Mobility  Activity Ambulated independently  Level of Assistance Independent after set-up  Assistive Device Front wheel walker  Distance Ambulated (ft) 200 ft  Range of Motion/Exercises Active  Activity Response Tolerated well  Mobility Referral No  Mobility visit 1 Mobility  Mobility Specialist Start Time (ACUTE ONLY) 1435  Mobility Specialist Stop Time (ACUTE ONLY) 1449  Mobility Specialist Time Calculation (min) (ACUTE ONLY) 14 min   Pt resting in bed on RA upon entry. Pt STS and ambulates to hallway around NS Indep after set-up with RW. Pt returned to bed and left with needs in reach.   Guido Rumble Mobility Specialist 03/31/24, 3:09 PM

## 2024-03-31 NOTE — Assessment & Plan Note (Signed)
 UA consistent with UTI and CT showing possible cystitis Sepsis criteria include fever, leukocytosis and lactic acidosis to 2.6-which has been resolved now Urine cultures with Klebsiella pneumonia Switching IV Levaquin  with Cipro  (given shortness of breath with penicillins) Follow cultures

## 2024-03-31 NOTE — Care Management Obs Status (Signed)
 MEDICARE OBSERVATION STATUS NOTIFICATION   Patient Details  Name: Kim Farmer MRN: [id number] Date of Birth: May 13, 1951   Medicare Observation Status Notification Given:  No (did not want a copy)    [name] 03/31/2024, 11:11 AM

## 2024-03-31 NOTE — Care Management Obs Status (Signed)
 MEDICARE OBSERVATION STATUS NOTIFICATION   Patient Details  Name: Kim Farmer MRN: [id number] Date of Birth: 01-Jun-1951   Medicare Observation Status Notification Given:  Yes    [name] 03/31/2024, 11:00 AM

## 2024-04-01 DIAGNOSIS — A419 Sepsis, unspecified organism: Secondary | ICD-10-CM | POA: Diagnosis not present

## 2024-04-01 DIAGNOSIS — N390 Urinary tract infection, site not specified: Secondary | ICD-10-CM | POA: Diagnosis not present

## 2024-04-01 MED ORDER — LEVOFLOXACIN 500 MG PO TABS: 500.0000 mg | ORAL_TABLET | Freq: Every day | ORAL | 0 refills | Status: AC

## 2024-04-01 MED ORDER — METOCLOPRAMIDE HCL 5 MG/ML IJ SOLN
5.0000 mg | Freq: Once | INTRAMUSCULAR | Status: AC
  Administered 2024-04-01: 5 mg via INTRAVENOUS
  Filled 2024-04-01: qty 2

## 2024-04-01 NOTE — Plan of Care (Signed)

## 2024-04-01 NOTE — Plan of Care (Signed)

## 2024-04-01 NOTE — Progress Notes (Signed)
 Patient complained of headache rated 8/10 and experience nausea and vomiting, iv zonfran was administered. Patient continued to feel unwell , ginger ale was provided , patient reported improvement and went to sleep,   Cathy,Rn

## 2024-04-01 NOTE — Plan of Care (Signed)
  Problem: Fluid Volume: Goal: Hemodynamic stability will improve 04/01/2024 1627 by Lynwood Donald LABOR, RN Outcome: Adequate for Discharge 04/01/2024 1441 by Lynwood Donald LABOR, RN Outcome: Progressing   Problem: Clinical Measurements: Goal: Diagnostic test results will improve 04/01/2024 1627 by Lynwood Donald LABOR, RN Outcome: Adequate for Discharge 04/01/2024 1441 by Lynwood Donald LABOR, RN Outcome: Progressing Goal: Signs and symptoms of infection will decrease 04/01/2024 1627 by Lynwood Donald LABOR, RN Outcome: Adequate for Discharge 04/01/2024 1441 by Lynwood Donald LABOR, RN Outcome: Progressing   Problem: Respiratory: Goal: Ability to maintain adequate ventilation will improve 04/01/2024 1627 by Lynwood Donald LABOR, RN Outcome: Adequate for Discharge 04/01/2024 1441 by Lynwood Donald LABOR, RN Outcome: Progressing   Problem: Education: Goal: Knowledge of General Education information will improve Description: Including pain rating scale, medication(s)/side effects and non-pharmacologic comfort measures 04/01/2024 1627 by Lynwood Donald LABOR, RN Outcome: Adequate for Discharge 04/01/2024 1441 by Lynwood Donald LABOR, RN Outcome: Progressing   Problem: Health Behavior/Discharge Planning: Goal: Ability to manage health-related needs will improve 04/01/2024 1627 by Lynwood Donald LABOR, RN Outcome: Adequate for Discharge 04/01/2024 1441 by Lynwood Donald LABOR, RN Outcome: Progressing   Problem: Clinical Measurements: Goal: Ability to maintain clinical measurements within normal limits will improve 04/01/2024 1627 by Lynwood Donald LABOR, RN Outcome: Adequate for Discharge 04/01/2024 1441 by Lynwood Donald LABOR, RN Outcome: Progressing Goal: Will remain free from infection 04/01/2024 1627 by Lynwood Donald LABOR, RN Outcome: Adequate for Discharge 04/01/2024 1441 by Lynwood Donald LABOR, RN Outcome: Progressing Goal: Diagnostic test results will improve 04/01/2024 1627 by Lynwood Donald LABOR, RN Outcome: Adequate for Discharge 04/01/2024 1441 by  Lynwood Donald LABOR, RN Outcome: Progressing Goal: Respiratory complications will improve 04/01/2024 1627 by Lynwood Donald LABOR, RN Outcome: Adequate for Discharge 04/01/2024 1441 by Lynwood Donald LABOR, RN Outcome: Progressing Goal: Cardiovascular complication will be avoided 04/01/2024 1627 by Lynwood Donald LABOR, RN Outcome: Adequate for Discharge 04/01/2024 1441 by Lynwood Donald LABOR, RN Outcome: Progressing   Problem: Activity: Goal: Risk for activity intolerance will decrease 04/01/2024 1627 by Lynwood Donald LABOR, RN Outcome: Adequate for Discharge 04/01/2024 1441 by Lynwood Donald LABOR, RN Outcome: Progressing   Problem: Nutrition: Goal: Adequate nutrition will be maintained 04/01/2024 1627 by Lynwood Donald LABOR, RN Outcome: Adequate for Discharge 04/01/2024 1441 by Lynwood Donald LABOR, RN Outcome: Progressing   Problem: Coping: Goal: Level of anxiety will decrease 04/01/2024 1627 by Lynwood Donald LABOR, RN Outcome: Adequate for Discharge 04/01/2024 1441 by Lynwood Donald LABOR, RN Outcome: Progressing   Problem: Elimination: Goal: Will not experience complications related to bowel motility 04/01/2024 1627 by Lynwood Donald LABOR, RN Outcome: Adequate for Discharge 04/01/2024 1441 by Lynwood Donald LABOR, RN Outcome: Progressing Goal: Will not experience complications related to urinary retention 04/01/2024 1627 by Lynwood Donald LABOR, RN Outcome: Adequate for Discharge 04/01/2024 1441 by Lynwood Donald LABOR, RN Outcome: Progressing   Problem: Pain Managment: Goal: General experience of comfort will improve and/or be controlled 04/01/2024 1627 by Lynwood Donald LABOR, RN Outcome: Adequate for Discharge 04/01/2024 1441 by Lynwood Donald LABOR, RN Outcome: Progressing   Problem: Safety: Goal: Ability to remain free from injury will improve 04/01/2024 1627 by Lynwood Donald LABOR, RN Outcome: Adequate for Discharge 04/01/2024 1441 by Lynwood Donald LABOR, RN Outcome: Progressing   Problem: Skin Integrity: Goal: Risk for impaired skin integrity  will decrease 04/01/2024 1627 by Lynwood Donald LABOR, RN Outcome: Adequate for Discharge 04/01/2024 1441 by Lynwood Donald LABOR, RN Outcome: Progressing

## 2024-04-01 NOTE — Discharge Summary (Signed)
 Physician Discharge Summary   Patient: Kim Farmer MRN: [id number] DOB: [birth year]  Admit date:     03/29/2024  Discharge date: 04/01/24  Discharge Physician: AIDA CHO   PCP: Epifanio Alm SQUIBB, MD   Recommendations at discharge:    Follow-up with PCP in 1 week  Discharge Diagnoses: Principal Problem:   Sepsis secondary to UTI [facility]) Active Problems:   History of esophageal stricture   Intractable vomiting   Barrett esophagus   Headache   Brain aneurysm   Chronic pain   Sinus bradycardia   Chronic prescription opiate use   Venous insufficiency (chronic) (peripheral)   Coronary artery calcification seen on CT scan   OSA (obstructive sleep apnea)   Generalized anxiety disorder   Essential hypertension   Tobacco use disorder, continuous   Acute cystitis without hematuria  Resolved Problems:   * No resolved hospital problems. *  Hospital Course:   Kim Farmer is a 73 y.o. female with multiple medical problems including but not limited to non-obstructive coronary artery disease, chronic dyspnea, chronic venous stasis, COPD, HTN, OSA, chronic pain syndrome on chronic opiates, anxiety, depression, recurrent nausea and vomiting, history of Barrett's esophagus/s/p esophageal dilatation(initially scheduled for EGD in November), mild cognitive impairment, lumbar degenerative disc disease, COPD (emphysema), tobacco use disorder.  She presented to the hospital with nausea, vomiting, abdominal pain and diarrhea.  She says that she has been vomiting for about 8 months on a regular basis.  However, she noticed more nausea and vomiting associated with abdominal pain and diarrhea.  She was found to have sepsis secondary to acute UTI.  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    Assessment and Plan:   Sepsis secondary to acute Klebsiella pneumoniae UTI: Urine culture showed Klebsiella pneumoniae. She initially received 1 dose of IV meropenem   on admission.  She subsequently received IV Levaquin  which was changed to oral ciprofloxacin . She will be discharged home on oral Levaquin  to complete 7 days of treatment   Recurrent nausea and vomiting, Barrett's esophagus, history of esophageal stricture s/p dilation: EGD with dilation.  On 03/30/2024.  EGD showed normal esophagus, stomach and normal examined duodenum. She says she has been vomiting on a regular basis for about 8 months.  Antiemetics as needed for nausea/vomiting. Outpatient follow-up with gastroenterologist for ongoing management. She recently saw her PCP, Dr. Epifanio, on 03/16/2024.  Follow-up with PCP for further management.   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 Outpatient follow-up with neurosurgeon recommended.  Ambulatory referral placed to Dr. Rosslyn Room, vascular neurosurgeon.   Mild bradycardia: Asymptomatic.  Heart rate mainly in the 60s and 70s today.   Comorbidities include general anxiety disorder, chronic venous insufficiency, chronic pain, chronic opioids, nonobstructive CAD, depression, tobacco use disorder, hypertension, COPD   Her condition has improved and she is deemed stable for discharge to home today.      Consultants: Gastroenterologist Procedures performed: EGD Disposition: Home Diet recommendation:  Discharge Diet Orders (From admission, onward)     Start     Ordered   04/01/24 0000  Diet - low sodium heart healthy        04/01/24 1554           Cardiac diet DISCHARGE MEDICATION: Allergies as of 04/01/2024       Reactions   Penicillins Shortness Of Breath   Throat swells  Sulfa Antibiotics Other (See Comments)   Unknown reaction as child        Medication List     STOP taking these medications    ALPRAZolam 1 MG tablet Commonly known as: XANAX   famotidine 20 MG tablet Commonly known as: PEPCID   lidocaine  5  % Commonly known as: LIDODERM    mupirocin ointment 2 % Commonly known as: BACTROBAN   oxybutynin 5 MG 24 hr tablet Commonly known as: DITROPAN-XL   Restasis 0.05 % ophthalmic emulsion Generic drug: cycloSPORINE   triamcinolone  cream 0.1 % Commonly known as: KENALOG    valACYclovir 500 MG tablet Commonly known as: VALTREX   Xifaxan 550 MG Tabs tablet Generic drug: rifaximin       TAKE these medications    aspirin  EC 81 MG tablet Take 1 tablet (81 mg total) by mouth daily.   atorvastatin  40 MG tablet Commonly known as: LIPITOR TAKE 1 TABLET(40 MG) BY MOUTH DAILY   busPIRone  5 MG tablet Commonly known as: BUSPAR  Take 5 mg by mouth 3 (three) times daily.   fluticasone  50 MCG/ACT nasal spray Commonly known as: FLONASE  SHAKE LIQUID AND USE 2 SPRAYS IN EACH NOSTRIL DAILY   furosemide 20 MG tablet Commonly known as: LASIX Take 20 mg by mouth daily.   levofloxacin  500 MG tablet Commonly known as: LEVAQUIN  Take 1 tablet (500 mg total) by mouth daily for 3 days. Start taking on: April 02, 2024   losartan 100 MG tablet Commonly known as: COZAAR Take 100 mg by mouth daily.   memantine  5 MG tablet Commonly known as: NAMENDA  Take 5 mg by mouth 2 (two) times daily.   naloxone 4 MG/0.1ML Liqd nasal spray kit Commonly known as: NARCAN Place 4 mg into the nose once.   omeprazole 40 MG capsule Commonly known as: PRILOSEC Take 40 mg by mouth 2 (two) times daily before a meal.   ondansetron  8 MG disintegrating tablet Commonly known as: ZOFRAN -ODT Take 8 mg by mouth 3 (three) times daily.   Oxycodone  HCl 10 MG Tabs Take 10 mg by mouth every 4 (four) hours as needed (Pain).   potassium chloride  SA 20 MEQ tablet Commonly known as: KLOR-CON  M Take 20 mEq by mouth daily.   ProAir  HFA 108 (90 Base) MCG/ACT inhaler Generic drug: albuterol  Inhale 1 puff into the lungs every 6 (six) hours as needed.   roflumilast  500 MCG Tabs tablet Commonly known as: DALIRESP  Take  500 mcg by mouth daily.   tamsulosin  0.4 MG Caps capsule Commonly known as: FLOMAX  Take 0.8 mg by mouth daily.   Trelegy Ellipta 100-62.5-25 MCG/ACT Aepb Generic drug: Fluticasone -Umeclidin-Vilant Inhale 1 puff into the lungs daily.   Trintellix  20 MG Tabs tablet Generic drug: vortioxetine  HBr Take 20 mg by mouth daily.   varenicline  1 MG tablet Commonly known as: CHANTIX  Take 1 mg by mouth 2 (two) times daily.   venlafaxine XR 37.5 MG 24 hr capsule Commonly known as: EFFEXOR-XR Take 37.5 mg by mouth 2 (two) times daily.        Follow-up Information     Epifanio Alm SQUIBB, MD Follow up.   Specialty: Infectious Diseases Why: hospital follow up Contact information: [address] [place] [address] [phone]                Discharge Exam: Fredricka Weights   03/29/24 1104  Weight: 67.1 kg   GEN: NAD SKIN: Warm and dry EYES: No pallor or icterus ENT: MMM CV: RRR PULM:  CTA B ABD: soft, ND, NT, +BS CNS: AAO x 3, non focal EXT: No edema or tenderness PSYCH: Calm and cooperative.  She was initially anxious but settled down later in the day.   Condition at discharge: good  The results of significant diagnostics from this hospitalization (including imaging, microbiology, ancillary and laboratory) are listed below for reference.   Imaging Studies: CT ANGIO HEAD NECK W WO CM Result Date: 03/29/2024 EXAM: CTA HEAD AND NECK WITH AND WITHOUT 03/29/2024 05:30:30 PM TECHNIQUE: CTA of the head and neck was performed with and without the administration of intravenous contrast. Multiplanar 2D and/or 3D reformatted images are provided for review. Automated exposure control, iterative reconstruction, and/or weight based adjustment of the mA/kV was utilized to reduce the radiation dose to as low as reasonably achievable. Stenosis of the internal carotid arteries measured using NASCET criteria. COMPARISON: None available CLINICAL HISTORY: Headache, nausea, and  vomiting. Patient has a history of surgery in January and reports emesis 5 out of 7 days every week since, with worsening symptoms today. Also reports diarrhea and headache. Patient took 2 ODT zofran  prior to EMS arrival. FINDINGS: CTA NECK: AORTIC ARCH AND ARCH VESSELS: Atherosclerosis of the aortic arch. No dissection or arterial injury. No significant stenosis of the brachiocephalic or subclavian arteries. CERVICAL CAROTID ARTERIES: Mild tortuosity of the proximal right common carotid artery with retropharyngeal course of the right common carotid artery and proximal right cervical ICA. No hemodynamically significant stenosis of the right cervical carotid artery. Additional tortuosity of the distal right cervical ICA. Retropharyngeal course of the left common carotid artery. No hemodynamically significant stenosis of the left cervical carotid artery. Tortuosity of the distal left cervical ICA. CERVICAL VERTEBRAL ARTERIES: The vertebral arteries are patent from the origins to the vertebrobasilar confluence. There is tortuosity of the left V1 segment. The V4 segment of the left vertebral artery is diminished in caliber after the origin of the left PICA. LUNGS AND MEDIASTINUM: Unremarkable. SOFT TISSUES: No acute abnormality. BONES: Degenerative changes in the visualized spine. There is partial fusion of the C4 through C6 vertebral bodies. CTA HEAD: ANTERIOR CIRCULATION: Atherosclerosis of the carotid siphons. There is mild stenosis of the cavernous and supraclinoid internal carotid arteries. The middle cerebral arteries are patent bilaterally. The anterior cerebral arteries are patent bilaterally. POSTERIOR CIRCULATION: The basilar artery is patent and is primarily supplied via the right vertebral artery. The posterior cerebral arteries are patent bilaterally. The superior cerebellar arteries are patent bilaterally. There is a 5 x 4 x 4 mm saccular outpouching along the mid portion of the left PICA compatible with  aneurysm seen on series 14 image 133 and series 15 image 143. OTHER: No dural venous sinus thrombosis on this non-dedicated study. Nonspecific hypoattenuation in the periventricular and subcortical white matter, most likely representing chronic small vessel disease. Mild parenchymal volume loss. Moderate right mastoid effusion. Degenerative changes of the mandibular condyles, greater on the right. IMPRESSION: 1. No large vessel occlusion or hemodynamically significant stenosis in the head or neck. 2. 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 3. Mild stenosis of the cavernous and supraclinoid internal carotid arteries. 4. Chronic microvascular ischemic changes and mild parenchymal volume loss. Electronically signed by: Donnice Mania MD 03/29/2024 06:53 PM EDT RP Workstation: [id number]   CT ABDOMEN PELVIS W CONTRAST Result Date: 03/29/2024 CLINICAL DATA:  Acute, non localized abdominal pain. Vomiting 5/7 days every week since surgery in January 2025. Diarrhea. EXAM: CT ABDOMEN  AND PELVIS WITH CONTRAST TECHNIQUE: Multidetector CT imaging of the abdomen and pelvis was performed using the standard protocol following bolus administration of intravenous contrast. RADIATION DOSE REDUCTION: This exam was performed according to the departmental dose-optimization program which includes automated exposure control, adjustment of the mA and/or kV according to patient size and/or use of iterative reconstruction technique. CONTRAST:  OMNIPAQUE  IOHEXOL  350 MG/ML SOLN COMPARISON:  05/18/2015 FINDINGS: Lower chest: Stable mildly enlarged heart. Minimal pericardial effusion. Minimal linear scarring at the left lung base. Hepatobiliary: No focal liver abnormality is seen. No gallstones, gallbladder wall thickening, or biliary dilatation. Previously demonstrated lateral segment left lobe low-density masses are fewer and smaller, currently with appearances suggesting cysts. Pancreas: The pancreatic  duct in the head of the pancreas remains dilated with mild progression, currently measuring 6.4 mm in diameter. This returns to normal size in the tail and distal body of the pancreas. No obstructing mass visualized. Spleen: Normal in size without focal abnormality. Adrenals/Urinary Tract: Interval 7 mm oval low-density mass in the mid to upper left kidney medially. This is too small to accurately characterize and has an appearance suggesting a small complicated cyst. Unremarkable adrenal glands, ureters and urinary bladder with the exception of interval minimal diffuse bladder wall thickening and enhancement. Stomach/Bowel: Unremarkable stomach, small bowel and colon. The appendix is not visualized. No secondary signs of appendicitis. Vascular/Lymphatic: Atheromatous arterial calcifications without aneurysm. No enlarged lymph nodes. Reproductive: Status post hysterectomy. No adnexal masses. Other: No abdominal wall hernia or abnormality. No abdominopelvic ascites. 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 Moderate bilateral hip degenerative changes. Mild thoracolumbar spine degenerative changes. IMPRESSION: 1. Interval minimal diffuse bladder wall thickening and enhancement. This can be seen with cystitis. 2. Mild progression of pancreatic ductal dilatation in the head of the pancreas, currently measuring 6.4 mm in diameter. This is of uncertain significance in the absence of a visualized mass. This is most likely related to progressive atrophy of those portions of the pancreas. 3. 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 4. Stable mild cardiomegaly. 5. Minimal pericardial effusion. Electronically Signed   By: Elspeth Bathe M.D.   On: 03/29/2024 18:44    Microbiology: Results for orders  placed or performed during the hospital encounter of 03/29/24  Urine Culture     Status: Abnormal   Collection Time: 03/29/24 11:08 AM   Specimen: Urine, Clean Catch  Result Value Ref Range Status   Specimen Description   Final    URINE, CLEAN CATCH Performed at [facility], [address] [place], [place] [address]    Special Requests   Final    NONE Performed at [facility], [address] [place], [place] [address]    Culture >=100,000 COLONIES/mL KLEBSIELLA PNEUMONIAE (A)  Final   Report Status 03/31/2024 FINAL  Final   Organism ID, Bacteria KLEBSIELLA PNEUMONIAE (A)  Final      Susceptibility   Klebsiella pneumoniae - MIC*    AMPICILLIN >=32 RESISTANT Resistant     CEFEPIME <=0.12 SENSITIVE Sensitive     ERTAPENEM <=0.12 SENSITIVE Sensitive     CEFTRIAXONE <=0.25 SENSITIVE Sensitive     CIPROFLOXACIN  <=0.06 SENSITIVE Sensitive     GENTAMICIN <=1 SENSITIVE Sensitive     NITROFURANTOIN 128 RESISTANT Resistant  TRIMETH/SULFA <=20 SENSITIVE Sensitive     AMPICILLIN/SULBACTAM 8 SENSITIVE Sensitive     PIP/TAZO Value in next row Sensitive ug/mL     <=4 SENSITIVEThis is a modified FDA-approved test that has been validated and its performance characteristics determined by the reporting laboratory.  This laboratory is certified under the Clinical Laboratory Improvement Amendments CLIA as qualified to perform high complexity clinical laboratory testing.    MEROPENEM  Value in next row Sensitive      <=4 SENSITIVEThis is a modified FDA-approved test that has been validated and its performance characteristics determined by the reporting laboratory.  This laboratory is certified under the Clinical Laboratory Improvement Amendments CLIA as qualified to perform high complexity clinical laboratory testing.    * >=100,000 COLONIES/mL KLEBSIELLA PNEUMONIAE  Blood culture (routine x 2)     Status: None (Preliminary result)   Collection Time: 03/29/24  2:12 PM   Specimen:  BLOOD  Result Value Ref Range Status   Specimen Description BLOOD BLOOD RIGHT ARM  Final   Special Requests   Final    BOTTLES DRAWN AEROBIC AND ANAEROBIC Blood Culture adequate volume   Culture   Final    NO GROWTH 3 DAYS Performed at [facility], [address], [place], [place] [address]    Report Status PENDING  Incomplete  Blood culture (routine x 2)     Status: None (Preliminary result)   Collection Time: 03/29/24  2:29 PM   Specimen: BLOOD  Result Value Ref Range Status   Specimen Description BLOOD BLOOD LEFT ARM  Final   Special Requests   Final    BOTTLES DRAWN AEROBIC AND ANAEROBIC Blood Culture results may not be optimal due to an inadequate volume of blood received in culture bottles   Culture   Final    NO GROWTH 3 DAYS Performed at [facility], [address] [place], [place] [address]    Report Status PENDING  Incomplete    Labs: CBC: Recent Labs  Lab 03/29/24 1108 03/30/24 0456  WBC 19.3* 10.0  HGB 11.6* 10.1*  HCT 34.5* 29.6*  MCV 88.7 88.4  PLT 302 217   Basic Metabolic Panel: Recent Labs  Lab 03/29/24 1108 03/30/24 0456  NA 138 142  K 3.6 3.5  CL 101 109  CO2 26 25  GLUCOSE 134* 87  BUN 10 8  CREATININE 0.96 0.78  CALCIUM  9.5 8.7*   Liver Function Tests: Recent Labs  Lab 03/29/24 1108  AST 29  ALT 15  ALKPHOS 101  BILITOT 0.7  PROT 7.1  ALBUMIN 4.1   CBG: No results for input(s): GLUCAP in the last 168 hours.  Discharge time spent: greater than 30 minutes.  Signed: AIDA CHO, MD [facility] 04/01/2024

## 2024-04-01 NOTE — Progress Notes (Signed)
 MD order received in [facility] to discharge pt home today; verbally reviewed AVS with pt, Rx escribed to [facility] for Levaquin ; no questions voiced at this time; verbally instructed pt to call on the nurse callbell when her niece is at the [facility] entrance in order for staff to get a wheelchair for discharge

## 2024-04-02 LAB — URINE CULTURE: Culture: >=100000 — AB

## 2024-04-03 LAB — CULTURE, BLOOD (ROUTINE X 2)
Culture: NO GROWTH
Culture: NO GROWTH
Special Requests: ADEQUATE

## 2024-04-08 ENCOUNTER — Emergency Department

## 2024-04-08 ENCOUNTER — Other Ambulatory Visit: Payer: Self-pay

## 2024-04-08 ENCOUNTER — Emergency Department
Admission: EM | Admit: 2024-04-08 | Discharge: 2024-04-08 | Disposition: A | Discharge status: Home / Self Care | Attending: Emergency Medicine | Admitting: Emergency Medicine

## 2024-04-08 DIAGNOSIS — R519 Headache, unspecified: Secondary | ICD-10-CM | POA: Insufficient documentation

## 2024-04-08 DIAGNOSIS — M25562 Pain in left knee: Secondary | ICD-10-CM | POA: Insufficient documentation

## 2024-04-08 DIAGNOSIS — J449 Chronic obstructive pulmonary disease, unspecified: Secondary | ICD-10-CM | POA: Insufficient documentation

## 2024-04-08 DIAGNOSIS — I2510 Atherosclerotic heart disease of native coronary artery without angina pectoris: Secondary | ICD-10-CM | POA: Diagnosis not present

## 2024-04-08 DIAGNOSIS — M11262 Other chondrocalcinosis, left knee: Secondary | ICD-10-CM | POA: Insufficient documentation

## 2024-04-08 DIAGNOSIS — M7989 Other specified soft tissue disorders: Secondary | ICD-10-CM | POA: Insufficient documentation

## 2024-04-08 DIAGNOSIS — M1712 Unilateral primary osteoarthritis, left knee: Secondary | ICD-10-CM | POA: Diagnosis not present

## 2024-04-08 DIAGNOSIS — I10 Essential (primary) hypertension: Secondary | ICD-10-CM | POA: Diagnosis not present

## 2024-04-08 DIAGNOSIS — M2550 Pain in unspecified joint: Secondary | ICD-10-CM | POA: Insufficient documentation

## 2024-04-08 DIAGNOSIS — Y900 Blood alcohol level of less than 20 mg/100 ml: Secondary | ICD-10-CM | POA: Insufficient documentation

## 2024-04-08 LAB — COMPREHENSIVE METABOLIC PANEL WITH GFR
ALT: 15 U/L (ref 0–44)
AST: 13 U/L — ABNORMAL LOW (ref 15–41)
Albumin: 3.7 g/dL (ref 3.5–5.0)
Alkaline Phosphatase: 109 U/L (ref 38–126)
Anion gap: 9 (ref 5–15)
BUN: 8 mg/dL (ref 8–23)
CO2: 24 mmol/L (ref 22–32)
Calcium: 9 mg/dL (ref 8.9–10.3)
Chloride: 100 mmol/L (ref 98–111)
Creatinine, Ser: 0.62 mg/dL (ref 0.44–1.00)
GFR, Estimated: >60 mL/min (ref >60)
Glucose, Bld: 127 mg/dL — ABNORMAL HIGH (ref 70–99)
Potassium: 3.7 mmol/L (ref 3.5–5.1)
Sodium: 133 mmol/L — ABNORMAL LOW (ref 135–145)
Total Bilirubin: 1 mg/dL (ref 0.0–1.2)
Total Protein: 7.2 g/dL (ref 6.5–8.1)

## 2024-04-08 LAB — DIFFERENTIAL
Abs Immature Granulocytes: 0.09 K/uL — ABNORMAL HIGH (ref 0.00–0.07)
Basophils Absolute: 0 K/uL (ref 0.0–0.1)
Basophils Relative: 0 %
Eosinophils Absolute: 0 K/uL (ref 0.0–0.5)
Eosinophils Relative: 0 %
Immature Granulocytes: 1 %
Lymphocytes Relative: 7 %
Lymphs Abs: 0.9 K/uL (ref 0.7–4.0)
Monocytes Absolute: 0.6 K/uL (ref 0.1–1.0)
Monocytes Relative: 5 %
Neutro Abs: 12.1 K/uL — ABNORMAL HIGH (ref 1.7–7.7)
Neutrophils Relative %: 87 %

## 2024-04-08 LAB — URINALYSIS, ROUTINE W REFLEX MICROSCOPIC
Bacteria, UA: NONE SEEN
Bilirubin Urine: NEGATIVE
Glucose, UA: NEGATIVE mg/dL
Ketones, ur: 5 mg/dL — AB
Leukocytes,Ua: NEGATIVE
Nitrite: NEGATIVE
Protein, ur: NEGATIVE mg/dL
Specific Gravity, Urine: 1.006 (ref 1.005–1.030)
pH: 7 (ref 5.0–8.0)

## 2024-04-08 LAB — CBC
HCT: 31 % — ABNORMAL LOW (ref 36.0–46.0)
Hemoglobin: 10.2 g/dL — ABNORMAL LOW (ref 12.0–15.0)
MCH: 29.3 pg (ref 26.0–34.0)
MCHC: 32.9 g/dL (ref 30.0–36.0)
MCV: 89.1 fL (ref 80.0–100.0)
Platelets: 353 K/uL (ref 150–400)
RBC: 3.48 MIL/uL — ABNORMAL LOW (ref 3.87–5.11)
RDW: 12.7 % (ref 11.5–15.5)
WBC: 13.8 K/uL — ABNORMAL HIGH (ref 4.0–10.5)
nRBC: 0 % (ref 0.0–0.2)

## 2024-04-08 LAB — ETHANOL: Alcohol, Ethyl (B): <15 mg/dL (ref <15)

## 2024-04-08 LAB — PROTIME-INR
INR: 1 (ref 0.8–1.2)
Prothrombin Time: 14.1 s (ref 11.4–15.2)

## 2024-04-08 LAB — LACTIC ACID, PLASMA: Lactic Acid, Venous: 1 mmol/L (ref 0.5–1.9)

## 2024-04-08 LAB — APTT: aPTT: 35 s (ref 24–36)

## 2024-04-08 MED ORDER — PREDNISONE 20 MG PO TABS
60.0000 mg | ORAL_TABLET | Freq: Once | ORAL | Status: AC
  Administered 2024-04-08: 60 mg via ORAL
  Filled 2024-04-08: qty 3

## 2024-04-08 MED ORDER — PREDNISONE 50 MG PO TABS: 50.0000 mg | ORAL_TABLET | Freq: Every day | ORAL | 0 refills | Status: DC

## 2024-04-08 MED ORDER — SODIUM CHLORIDE 0.9 % IV BOLUS
500.0000 mL | Freq: Once | INTRAVENOUS | Status: AC
  Administered 2024-04-08: 500 mL via INTRAVENOUS

## 2024-04-08 MED ORDER — MORPHINE SULFATE (PF) 4 MG/ML IV SOLN
4.0000 mg | Freq: Once | INTRAVENOUS | Status: AC
  Administered 2024-04-08: 4 mg via INTRAVENOUS
  Filled 2024-04-08: qty 1

## 2024-04-08 MED ORDER — PREDNISONE 50 MG PO TABS: 50.0000 mg | ORAL_TABLET | Freq: Every day | ORAL | 0 refills | Status: AC

## 2024-04-08 MED ORDER — ONDANSETRON HCL 4 MG/2ML IJ SOLN
4.0000 mg | Freq: Once | INTRAMUSCULAR | Status: AC
  Administered 2024-04-08: 4 mg via INTRAVENOUS
  Filled 2024-04-08: qty 2

## 2024-04-08 MED ORDER — KETOROLAC TROMETHAMINE 15 MG/ML IJ SOLN
15.0000 mg | Freq: Once | INTRAMUSCULAR | Status: AC
  Administered 2024-04-08: 15 mg via INTRAVENOUS
  Filled 2024-04-08: qty 1

## 2024-04-08 MED ORDER — SODIUM CHLORIDE 0.9% FLUSH
3.0000 mL | Freq: Once | INTRAVENOUS | Status: AC
  Administered 2024-04-08: 3 mL via INTRAVENOUS

## 2024-04-08 NOTE — ED Notes (Signed)
Walker provided to pt.

## 2024-04-08 NOTE — ED Notes (Signed)
 Dc instructions and scripts reviewed with pt. Pt wheeled out to lobby and waiting for nephew to transport home. Pt has walker to go home with. No questions or concerns at this time. Verbalized much improvement.

## 2024-04-08 NOTE — ED Provider Notes (Signed)
 [facility] Provider Note    Event Date/Time   First MD Initiated Contact with Patient 04/08/24 1212     (approximate)   History   Headache, joint pains  HPI  Kim Farmer is a 73 y.o. female with a history of COPD, CAD, hypertension, GERD, osteoarthritis who presents with complaints of headache, now resolved and joint pains.  Her primary complaint is joint pains, she complains of pain mild swelling in her left knee, right wrist, left wrist she reports this developed yesterday.  Review of medical records demonstrates she was discharged on August 17 from the hospital after treatment for urinary tract infection     Physical Exam   Triage Vital Signs: ED Triage Vitals  Encounter Vitals Group     BP 04/08/24 1050 (!) 143/71     Girls Systolic BP Percentile --      Girls Diastolic BP Percentile --      Boys Systolic BP Percentile --      Boys Diastolic BP Percentile --      Pulse Rate 04/08/24 1050 67     Resp 04/08/24 1050 18     Temp 04/08/24 1050 98.5 F (36.9 C)     Temp Source 04/08/24 1050 Oral     SpO2 04/08/24 1050 96 %     Weight --      Height --      Head Circumference --      Peak Flow --      Pain Score 04/08/24 1042 4     Pain Loc --      Pain Education --      Exclude from Growth Chart --     Most recent vital signs: Vitals:   04/08/24 1600 04/08/24 1737  BP:  (!) 165/78  Pulse:  66  Resp:  18  Temp: 98.5 F (36.9 C) 98.4 F (36.9 C)  SpO2:  97%     General: Awake, no distress.  Anxious CV:  Good peripheral perfusion.  Regular rate and rhythm Resp:  Normal effort.  Clear to auscultation Abd:  No distention.  Soft, nontender Other:  Mild left knee swelling, no erythema, good range of motion of all extremities.  No vertebral tenderness palpation Cranial nerves II through XII are normal, normal strength in all extremities  ED Results / Procedures / Treatments   Labs (all labs ordered are listed, but only abnormal  results are displayed) Labs Reviewed  CBC - Abnormal; Notable for the following components:      Result Value   WBC 13.8 (*)    RBC 3.48 (*)    Hemoglobin 10.2 (*)    HCT 31.0 (*)    All other components within normal limits  DIFFERENTIAL - Abnormal; Notable for the following components:   Neutro Abs 12.1 (*)    Abs Immature Granulocytes 0.09 (*)    All other components within normal limits  COMPREHENSIVE METABOLIC PANEL WITH GFR - Abnormal; Notable for the following components:   Sodium 133 (*)    Glucose, Bld 127 (*)    AST 13 (*)    All other components within normal limits  URINALYSIS, ROUTINE W REFLEX MICROSCOPIC - Abnormal; Notable for the following components:   Color, Urine YELLOW (*)    APPearance CLEAR (*)    Hgb urine dipstick SMALL (*)    Ketones, ur 5 (*)    All other components within normal limits  PROTIME-INR  APTT  ETHANOL  LACTIC ACID,  PLASMA  CBG MONITORING, ED     EKG  ED ECG REPORT I, Lamar Price, the attending physician, personally viewed and interpreted this ECG.  Date: 04/08/2024 EKG Time:  Rate:  Rhythm: normal sinus rhythm QRS Axis: normal Intervals: normal ST/T Wave abnormalities: normal Narrative Interpretation: no evidence of acute ischemia   RADIOLOGY Knee x-ray viewed interpreted by me, no fracture    PROCEDURES:  Critical Care performed:   Procedures   MEDICATIONS ORDERED IN ED: Medications  sodium chloride  flush (NS) 0.9 % injection 3 mL (3 mLs Intravenous Given 04/08/24 1520)  morphine  (PF) 4 MG/ML injection 4 mg (4 mg Intravenous Given 04/08/24 1318)  ondansetron  (ZOFRAN ) injection 4 mg (4 mg Intravenous Given 04/08/24 1320)  sodium chloride  0.9 % bolus 500 mL (0 mLs Intravenous Stopped 04/08/24 1600)  ketorolac  (TORADOL ) 15 MG/ML injection 15 mg (15 mg Intravenous Given 04/08/24 1519)  predniSONE  (DELTASONE ) tablet 60 mg (60 mg Oral Given 04/08/24 1559)     IMPRESSION / MDM / ASSESSMENT AND PLAN / ED COURSE  I  reviewed the triage vital signs and the nursing notes. Patient's presentation is most consistent with exacerbation of chronic illness.  Patient presents with symptoms as described above, overall well-appearing and in no acute distress.  Headache resolved prior to arrival, no neurodeficits to suggest CVA, her primary complaint is polyarthralgia, unclear cause of this.  Will treat with IV morphine , IV Zofran , IV fluids low-dose IV Toradol  and reevaluate  Lab work reviewed and is overall reassuring [name] has any dysuria, lactic acid is normal.  She is afebrile.  No signs of infection  Left knee x-ray is reassuring.  Patient had significant improvement with steroids, she is ambulating much better now we will provide a walker for extra safety, no indication for admission at this time, appropriate for discharge with close outpatient follow-up, return precautions discussed, she agrees with this plan        FINAL CLINICAL IMPRESSION(S) / ED DIAGNOSES   Final diagnoses:  Polyarthralgia     Rx / DC Orders   ED Discharge Orders          Ordered    predniSONE  (DELTASONE ) 50 MG tablet  Daily with breakfast,   Status:  Discontinued        04/08/24 1535    predniSONE  (DELTASONE ) 50 MG tablet  Daily with breakfast        04/08/24 1536             Note:  This document was prepared using Dragon voice recognition software and may include unintentional dictation errors.   Price Lamar, MD 04/08/24 2011

## 2024-04-08 NOTE — ED Triage Notes (Signed)
 Pt comes via POV with c/o numbness to neck. Knees and hands. Pt states this started yesterday evening. Pt denies any cp, sob, blurry vision or slurred speech.   Pt was here recently and admitted for UTI with sepsis.

## 2024-05-15 ENCOUNTER — Emergency Department (HOSPITAL_COMMUNITY)
Admission: EM | Admit: 2024-05-15 | Discharge: 2024-05-15 | Disposition: A | Discharge status: Home / Self Care | Attending: Emergency Medicine | Admitting: Emergency Medicine

## 2024-05-15 ENCOUNTER — Other Ambulatory Visit: Payer: Self-pay

## 2024-05-15 ENCOUNTER — Emergency Department (HOSPITAL_COMMUNITY)

## 2024-05-15 DIAGNOSIS — Z7982 Long term (current) use of aspirin: Secondary | ICD-10-CM | POA: Insufficient documentation

## 2024-05-15 DIAGNOSIS — R109 Unspecified abdominal pain: Secondary | ICD-10-CM | POA: Insufficient documentation

## 2024-05-15 DIAGNOSIS — R112 Nausea with vomiting, unspecified: Secondary | ICD-10-CM

## 2024-05-15 DIAGNOSIS — R229 Localized swelling, mass and lump, unspecified: Secondary | ICD-10-CM

## 2024-05-15 DIAGNOSIS — R1110 Vomiting, unspecified: Secondary | ICD-10-CM | POA: Diagnosis present

## 2024-05-15 LAB — CBC
HCT: 39.7 % (ref 36.0–46.0)
Hemoglobin: 12.3 g/dL (ref 12.0–15.0)
MCH: 28.7 pg (ref 26.0–34.0)
MCHC: 31 g/dL (ref 30.0–36.0)
MCV: 92.8 fL (ref 80.0–100.0)
Platelets: 388 K/uL (ref 150–400)
RBC: 4.28 MIL/uL (ref 3.87–5.11)
RDW: 14.8 % (ref 11.5–15.5)
WBC: 10 K/uL (ref 4.0–10.5)
nRBC: 0 % (ref 0.0–0.2)

## 2024-05-15 LAB — COMPREHENSIVE METABOLIC PANEL WITH GFR
ALT: 27 U/L (ref 0–44)
AST: 16 U/L (ref 15–41)
Albumin: 4.7 g/dL (ref 3.5–5.0)
Alkaline Phosphatase: 113 U/L (ref 38–126)
Anion gap: 14 (ref 5–15)
BUN: 8 mg/dL (ref 8–23)
CO2: 21 mmol/L — ABNORMAL LOW (ref 22–32)
Calcium: 10.1 mg/dL (ref 8.9–10.3)
Chloride: 104 mmol/L (ref 98–111)
Creatinine, Ser: 0.69 mg/dL (ref 0.44–1.00)
GFR, Estimated: >60 mL/min (ref >60)
Glucose, Bld: 141 mg/dL — ABNORMAL HIGH (ref 70–99)
Potassium: 3.8 mmol/L (ref 3.5–5.1)
Sodium: 139 mmol/L (ref 135–145)
Total Bilirubin: 0.4 mg/dL (ref 0.0–1.2)
Total Protein: 7.3 g/dL (ref 6.5–8.1)

## 2024-05-15 LAB — URINALYSIS, ROUTINE W REFLEX MICROSCOPIC
Bilirubin Urine: NEGATIVE
Glucose, UA: NEGATIVE mg/dL
Hgb urine dipstick: NEGATIVE
Ketones, ur: NEGATIVE mg/dL
Nitrite: NEGATIVE
Protein, ur: NEGATIVE mg/dL
Specific Gravity, Urine: 1.015 (ref 1.005–1.030)
pH: 6 (ref 5.0–8.0)

## 2024-05-15 LAB — LIPASE, BLOOD: Lipase: 51 U/L (ref 11–51)

## 2024-05-15 MED ORDER — FAMOTIDINE IN NACL 20-0.9 MG/50ML-% IV SOLN
20.0000 mg | Freq: Once | INTRAVENOUS | Status: AC
  Administered 2024-05-15: 20 mg via INTRAVENOUS
  Filled 2024-05-15: qty 50

## 2024-05-15 MED ORDER — HYDROMORPHONE HCL 1 MG/ML IJ SOLN
1.0000 mg | Freq: Once | INTRAMUSCULAR | Status: AC
  Administered 2024-05-15: 1 mg via INTRAVENOUS
  Filled 2024-05-15: qty 1

## 2024-05-15 MED ORDER — IOHEXOL 300 MG/ML  SOLN
100.0000 mL | Freq: Once | INTRAMUSCULAR | Status: AC | PRN
  Administered 2024-05-15: 100 mL via INTRAVENOUS

## 2024-05-15 MED ORDER — ONDANSETRON HCL 4 MG/2ML IJ SOLN
4.0000 mg | Freq: Once | INTRAMUSCULAR | Status: AC
  Administered 2024-05-15: 4 mg via INTRAVENOUS
  Filled 2024-05-15: qty 2

## 2024-05-15 NOTE — ED Triage Notes (Signed)
 Pt BIBA from [place]. Pt reports little knots all over her stomach like a pinto bean. Reports emesis like foam. Hurts up under rib cage. Reports diarrhea yesterday. Also HA from brain surgery.

## 2024-05-15 NOTE — Discharge Instructions (Addendum)
 Get help right away if: You have pain in your chest, neck, arm, or jaw. You feel extremely weak or you faint. You have vomit that is bright red or looks like coffee grounds. You have bloody or black stools (feces) or stools that look like tar. You have a severe headache, a stiff neck, or both. You have severe pain, cramping, or bloating in your abdomen. You have difficulty breathing or are breathing very quickly. Your heart is beating very quickly. Your skin feels cold and clammy. You feel confused. You have signs of dehydration, such as: Dark urine, very little urine, or no urine. Cracked lips. Dry mouth. Sunken eyes. Sleepiness. Weakness. These symptoms may be an emergency. Get help right away. Call 911. Do not wait to see if the symptoms will go away. Do not drive yourself to the hospital.

## 2024-05-15 NOTE — ED Provider Notes (Signed)
 [place] EMERGENCY DEPARTMENT AT [facility] Provider Note   CSN: [id number] Arrival date & time: 05/15/24  1320     Patient presents with: Abdominal Pain and Emesis   Kim Farmer is a 73 y.o. female.  Who presents emergency department with chief complaint of 10 months of vomiting.  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  She has been vomiting almost every day for 10 months.  She has had about 15 days of not vomiting.  She is also having significant pain from having her aneurysm repaired and has not had her oxycodone  in the last 6 hours because she has been waiting to be seen.  Patient reports that she is not had any sleep in the last 2 days and she is extremely tearful and hungry.  She denies any chest pain.  She also noticed some pinto bean sized lumps in her lower abdomen.  These seem to be correlated with old bruising that appears to be from Lovenox injections from her recent hospitalization.  She states that they are extremely painful.  She has no other complaints at this time    Abdominal Pain Associated symptoms: vomiting   Emesis Associated symptoms: abdominal pain        Prior to Admission medications   Medication Sig Start Date End Date Taking? Authorizing Provider  albuterol  (PROAIR  HFA) 108 (90 BASE) MCG/ACT inhaler Inhale 1 puff into the lungs every 6 (six) hours as needed.  05/23/15   [provider]  aspirin  EC 81 MG tablet Take 1 tablet (81 mg total) by mouth daily. Patient not taking: Reported on 03/29/2024 11/04/15   Sowles, Krichna, MD  atorvastatin  (LIPITOR) 40 MG tablet TAKE 1 TABLET(40 MG) BY MOUTH DAILY 02/27/16   Sowles, Krichna, MD  busPIRone  (BUSPAR ) 5 MG tablet Take 5 mg by mouth 3 (three) times daily. 03/27/24   [provider]  fluticasone  (FLONASE ) 50 MCG/ACT nasal spray SHAKE LIQUID AND USE 2 SPRAYS IN EACH NOSTRIL DAILY 06/24/16   Sowles, Krichna, MD  furosemide (LASIX) 20 MG tablet Take 20 mg by mouth daily.    [provider]  losartan (COZAAR) 100 MG tablet Take 100 mg by mouth daily.    [provider]  memantine  (NAMENDA ) 5 MG tablet Take 5 mg by mouth 2 (two) times daily.    [provider]  naloxone [facility]) nasal spray 4 mg/0.1 mL Place 4 mg into the nose once. 09/22/23   [provider]  omeprazole (PRILOSEC) 40 MG capsule Take 40 mg by mouth 2 (two) times daily before a meal. 03/22/24   [provider]  ondansetron  (ZOFRAN -ODT) 8 MG disintegrating tablet Take 8 mg by mouth 3 (three) times daily.    [provider]  potassium chloride  SA (KLOR-CON  M) 20 MEQ tablet Take 20 mEq by mouth daily.    [provider]  predniSONE  (DELTASONE ) 50 MG tablet Take 1 tablet (50 mg total) by mouth daily with breakfast. 04/08/24   Arlander Charleston, MD  roflumilast  (DALIRESP ) 500 MCG TABS tablet Take 500 mcg by mouth daily.    [provider]  tamsulosin  (FLOMAX ) 0.4 MG CAPS capsule Take 0.8 mg by mouth daily.    [provider]  TRELEGY ELLIPTA 100-62.5-25 MCG/ACT AEPB Inhale 1 puff into the lungs daily.    [provider]  TRINTELLIX  20 MG TABS tablet Take 20 mg by mouth daily.    [provider]  varenicline  (CHANTIX ) 1 MG tablet Take 1 mg by  mouth 2 (two) times daily.    [provider]  venlafaxine XR (EFFEXOR-XR) 37.5 MG 24 hr capsule Take 37.5 mg by mouth 2 (two) times daily.    [provider]    Allergies: Penicillins and Sulfa antibiotics    Review of Systems  Gastrointestinal:  Positive for abdominal pain and vomiting.    Updated Vital Signs BP (!) 149/93   Pulse 85   Temp 99.5 F (37.5 C) (Oral)   Resp 14   SpO2 100%   Physical Exam Vitals and nursing note reviewed.  Constitutional:      General: She is not in acute distress.    Appearance: She is well-developed. She is not diaphoretic.  HENT:     Head: Normocephalic and atraumatic.     Right Ear: External ear normal.     Left Ear:  External ear normal.     Nose: Nose normal.     Mouth/Throat:     Mouth: Mucous membranes are moist.  Eyes:     General: No scleral icterus.    Conjunctiva/sclera: Conjunctivae normal.  Cardiovascular:     Rate and Rhythm: Normal rate and regular rhythm.     Heart sounds: Normal heart sounds. No murmur heard.    No friction rub. No gallop.  Pulmonary:     Effort: Pulmonary effort is normal. No respiratory distress.     Breath sounds: Normal breath sounds.  Abdominal:     General: Bowel sounds are normal. There is no distension.     Palpations: Abdomen is soft. There is no mass.     Tenderness: There is no abdominal tenderness. There is no guarding.     Comments: Several small mobile firm lumps in the lower abdomen.  These appear to be superficial and correlated with multiple old bruises on the lower abdomen.  These appear to be old Lovenox injections from her recent hospitalization.  Musculoskeletal:     Cervical back: Normal range of motion.  Skin:    General: Skin is warm and dry.  Neurological:     Mental Status: She is alert and oriented to person, place, and time.  Psychiatric:        Behavior: Behavior normal.     (all labs ordered are listed, but only abnormal results are displayed) Labs Reviewed  COMPREHENSIVE METABOLIC PANEL WITH GFR - Abnormal; Notable for the following components:      Result Value   CO2 21 (*)    Glucose, Bld 141 (*)    All other components within normal limits  URINALYSIS, ROUTINE W REFLEX MICROSCOPIC - Abnormal; Notable for the following components:   Leukocytes,Ua SMALL (*)    Bacteria, UA RARE (*)    All other components within normal limits  LIPASE, BLOOD  CBC    EKG: None  Radiology: No results found.   Procedures   Medications Ordered in the ED - No data to display  Clinical Course as of 05/15/24 2107  Tue May 15, 2024  2100 CT ABDOMEN PELVIS W CONTRAST I visualized and interpreted cta bd [AH]    Clinical Course User  Index [AH] Arloa Chroman, PA-C                                 Medical Decision Making 73 year old female here with complaint of vomiting.  Vomiting has been chronic for a long time. The emergent differential diagnosis for vomiting includes, but is  not limited to ACS/MI, DKA, Ischemic bowel, Meningitis, Sepsis, Acute gastric dilation, Adrenal insufficiency, Appendicitis,  Bowel obstruction/ileus, Carbon monoxide poisoning, Cholecystitis, Electrolyte abnormalities, Elevated ICP, Gastric outlet obstruction, Pancreatitis, Ruptured viscus, Biliary colic, Cannabinoid hyperemesis syndrome, Gastritis, Gastroenteritis, Gastroparesis,  Narcotic withdrawal, Peptic ulcer disease, and UTI  I ordered and reviewed labs which are fairly unremarkable.  Urine does not appear infected at this time.  She is not having symptoms of that.  CT Abdo pelvis I visualized and interpreted show no acute findings.  She is not having any active vomiting during her visit.  The knots on her stomach appear to be small bruises from Lovenox injections.  No acute findings otherwise.  Will discharge with antiemetics.  Appears otherwise appropriate for discharge at this time.  Amount and/or Complexity of Data Reviewed Labs: ordered. Radiology: ordered. Decision-making details documented in ED Course.  Risk Prescription drug management.        Final diagnoses:  None    ED Discharge Orders     None          Arloa Chroman, PA-C 05/19/24 2352    Bari Roxie HERO, DO 06/01/24 1612

## 2024-06-05 ENCOUNTER — Other Ambulatory Visit: Payer: Self-pay | Admitting: Infectious Diseases

## 2024-06-05 DIAGNOSIS — Z1231 Encounter for screening mammogram for malignant neoplasm of breast: Secondary | ICD-10-CM

## 2024-06-14 ENCOUNTER — Ambulatory Visit
Admission: RE | Admit: 2024-06-14 | Discharge: 2024-06-14 | Disposition: A | Discharge status: Home / Self Care | Source: Ambulatory Visit | Attending: Infectious Diseases | Admitting: Infectious Diseases

## 2024-06-14 ENCOUNTER — Other Ambulatory Visit: Payer: Self-pay | Admitting: Infectious Diseases

## 2024-06-14 DIAGNOSIS — Z1231 Encounter for screening mammogram for malignant neoplasm of breast: Secondary | ICD-10-CM | POA: Insufficient documentation
